# Patient Record
Sex: Male | Born: 1935 | Race: Black or African American | Hispanic: No | Smoking: Former smoker
Health system: Southern US, Community
[De-identification: ages and names within clinical notes are randomized; demographics above are authoritative.]

## PROBLEM LIST (undated history)

## (undated) DIAGNOSIS — J449 Chronic obstructive pulmonary disease, unspecified: Secondary | ICD-10-CM

## (undated) DIAGNOSIS — D649 Anemia, unspecified: Secondary | ICD-10-CM

## (undated) DIAGNOSIS — M869 Osteomyelitis, unspecified: Secondary | ICD-10-CM

## (undated) DIAGNOSIS — I739 Peripheral vascular disease, unspecified: Secondary | ICD-10-CM

## (undated) DIAGNOSIS — S2239XA Fracture of one rib, unspecified side, initial encounter for closed fracture: Secondary | ICD-10-CM

## (undated) DIAGNOSIS — I10 Essential (primary) hypertension: Secondary | ICD-10-CM

## (undated) DIAGNOSIS — K852 Alcohol induced acute pancreatitis without necrosis or infection: Secondary | ICD-10-CM

## (undated) DIAGNOSIS — F1010 Alcohol abuse, uncomplicated: Secondary | ICD-10-CM

## (undated) HISTORY — DX: Alcohol induced acute pancreatitis without necrosis or infection: K85.20

## (undated) HISTORY — DX: Anemia, unspecified: D64.9

## (undated) HISTORY — DX: Fracture of one rib, unspecified side, initial encounter for closed fracture: S22.39XA

## (undated) HISTORY — DX: Osteomyelitis, unspecified: M86.9

## (undated) HISTORY — DX: Chronic obstructive pulmonary disease, unspecified: J44.9

## (undated) HISTORY — DX: Peripheral vascular disease, unspecified: I73.9

## (undated) HISTORY — DX: Alcohol abuse, uncomplicated: F10.10

## (undated) HISTORY — DX: Essential (primary) hypertension: I10

---

## 1998-03-11 ENCOUNTER — Emergency Department (HOSPITAL_COMMUNITY): Admission: EM | Admit: 1998-03-11 | Discharge: 1998-03-11 | Payer: Self-pay | Admitting: Emergency Medicine

## 1998-03-11 ENCOUNTER — Encounter: Payer: Self-pay | Admitting: Emergency Medicine

## 1998-04-02 ENCOUNTER — Other Ambulatory Visit: Admission: RE | Admit: 1998-04-02 | Discharge: 1998-04-02 | Payer: Self-pay | Admitting: Family Medicine

## 2001-05-30 HISTORY — PX: TOTAL KNEE ARTHROPLASTY: SHX125

## 2001-06-08 ENCOUNTER — Emergency Department (HOSPITAL_COMMUNITY): Admission: EM | Admit: 2001-06-08 | Discharge: 2001-06-08 | Payer: Self-pay | Admitting: *Deleted

## 2001-06-08 ENCOUNTER — Encounter: Payer: Self-pay | Admitting: Family Medicine

## 2001-06-20 ENCOUNTER — Encounter: Payer: Self-pay | Admitting: Cardiology

## 2001-06-20 ENCOUNTER — Encounter: Payer: Self-pay | Admitting: Emergency Medicine

## 2001-06-20 ENCOUNTER — Inpatient Hospital Stay (HOSPITAL_COMMUNITY): Admission: EM | Admit: 2001-06-20 | Discharge: 2001-06-25 | Payer: Self-pay

## 2001-06-20 ENCOUNTER — Encounter (INDEPENDENT_AMBULATORY_CARE_PROVIDER_SITE_OTHER): Payer: Self-pay | Admitting: *Deleted

## 2001-06-21 ENCOUNTER — Encounter: Payer: Self-pay | Admitting: Cardiology

## 2001-06-25 ENCOUNTER — Encounter: Payer: Self-pay | Admitting: Cardiology

## 2001-06-30 HISTORY — PX: TOTAL HIP ARTHROPLASTY: SHX124

## 2001-10-06 ENCOUNTER — Encounter: Payer: Self-pay | Admitting: Orthopedic Surgery

## 2001-10-11 ENCOUNTER — Encounter: Payer: Self-pay | Admitting: Orthopedic Surgery

## 2001-10-11 ENCOUNTER — Inpatient Hospital Stay (HOSPITAL_COMMUNITY): Admission: RE | Admit: 2001-10-11 | Discharge: 2001-10-16 | Payer: Self-pay | Admitting: Orthopedic Surgery

## 2001-12-06 ENCOUNTER — Inpatient Hospital Stay (HOSPITAL_COMMUNITY): Admission: RE | Admit: 2001-12-06 | Discharge: 2001-12-09 | Payer: Self-pay | Admitting: Orthopedic Surgery

## 2001-12-09 ENCOUNTER — Inpatient Hospital Stay (HOSPITAL_COMMUNITY)
Admission: AD | Admit: 2001-12-09 | Discharge: 2001-12-13 | Payer: Self-pay | Admitting: Physical Medicine & Rehabilitation

## 2002-05-20 ENCOUNTER — Encounter: Payer: Self-pay | Admitting: Vascular Surgery

## 2002-05-24 ENCOUNTER — Ambulatory Visit (HOSPITAL_COMMUNITY): Admission: RE | Admit: 2002-05-24 | Discharge: 2002-05-24 | Payer: Self-pay | Admitting: Vascular Surgery

## 2002-06-30 HISTORY — PX: FEMORAL-POPLITEAL BYPASS GRAFT: SHX937

## 2002-10-24 ENCOUNTER — Encounter: Payer: Self-pay | Admitting: Vascular Surgery

## 2002-10-26 ENCOUNTER — Inpatient Hospital Stay (HOSPITAL_COMMUNITY): Admission: RE | Admit: 2002-10-26 | Discharge: 2002-10-29 | Payer: Self-pay | Admitting: Vascular Surgery

## 2002-10-29 HISTORY — PX: TOE AMPUTATION: SHX809

## 2002-11-25 ENCOUNTER — Ambulatory Visit (HOSPITAL_COMMUNITY): Admission: RE | Admit: 2002-11-25 | Discharge: 2002-11-28 | Payer: Self-pay | Admitting: Vascular Surgery

## 2002-11-25 ENCOUNTER — Encounter (INDEPENDENT_AMBULATORY_CARE_PROVIDER_SITE_OTHER): Payer: Self-pay | Admitting: Specialist

## 2002-12-16 ENCOUNTER — Encounter (HOSPITAL_BASED_OUTPATIENT_CLINIC_OR_DEPARTMENT_OTHER): Admission: RE | Admit: 2002-12-16 | Discharge: 2003-03-16 | Payer: Self-pay | Admitting: Vascular Surgery

## 2002-12-20 ENCOUNTER — Emergency Department (HOSPITAL_COMMUNITY): Admission: EM | Admit: 2002-12-20 | Discharge: 2002-12-20 | Payer: Self-pay | Admitting: Emergency Medicine

## 2002-12-30 ENCOUNTER — Ambulatory Visit (HOSPITAL_COMMUNITY): Admission: RE | Admit: 2002-12-30 | Discharge: 2002-12-30 | Payer: Self-pay | Admitting: Vascular Surgery

## 2003-09-06 ENCOUNTER — Ambulatory Visit (HOSPITAL_COMMUNITY): Admission: RE | Admit: 2003-09-06 | Discharge: 2003-09-06 | Payer: Self-pay | Admitting: Cardiology

## 2004-04-05 ENCOUNTER — Encounter: Admission: RE | Admit: 2004-04-05 | Discharge: 2004-04-05 | Payer: Self-pay | Admitting: Cardiology

## 2004-10-18 ENCOUNTER — Emergency Department (HOSPITAL_COMMUNITY): Admission: EM | Admit: 2004-10-18 | Discharge: 2004-10-18 | Payer: Self-pay

## 2004-12-30 ENCOUNTER — Ambulatory Visit (HOSPITAL_COMMUNITY): Admission: RE | Admit: 2004-12-30 | Discharge: 2004-12-30 | Payer: Self-pay | Admitting: Vascular Surgery

## 2004-12-30 ENCOUNTER — Encounter (INDEPENDENT_AMBULATORY_CARE_PROVIDER_SITE_OTHER): Payer: Self-pay | Admitting: *Deleted

## 2005-05-15 ENCOUNTER — Encounter: Admission: RE | Admit: 2005-05-15 | Discharge: 2005-05-15 | Payer: Self-pay | Admitting: Cardiology

## 2006-03-21 ENCOUNTER — Emergency Department (HOSPITAL_COMMUNITY): Admission: EM | Admit: 2006-03-21 | Discharge: 2006-03-22 | Payer: Self-pay | Admitting: Emergency Medicine

## 2006-06-01 ENCOUNTER — Ambulatory Visit: Payer: Self-pay | Admitting: Internal Medicine

## 2006-06-16 ENCOUNTER — Ambulatory Visit: Payer: Self-pay | Admitting: Internal Medicine

## 2006-06-16 LAB — CONVERTED CEMR LAB
ALT: 22 units/L (ref 0–40)
Alkaline Phosphatase: 46 units/L (ref 39–117)
BUN: 14 mg/dL (ref 6–23)
Basophils Absolute: 0 10*3/uL (ref 0.0–0.1)
CO2: 21 meq/L (ref 19–32)
Calcium: 8.9 mg/dL (ref 8.4–10.5)
Chloride: 107 meq/L (ref 96–112)
Chol/HDL Ratio, serum: 1.9
Cholesterol: 148 mg/dL (ref 0–200)
Creatinine, Ser: 1.5 mg/dL (ref 0.4–1.5)
HCT: 26.2 % — ABNORMAL LOW (ref 39.0–52.0)
HDL: 77.4 mg/dL (ref >39.0)
Iron: 18 ug/dL — ABNORMAL LOW (ref 42–165)
LDL Cholesterol: 63 mg/dL (ref 0–99)
MCHC: 32 g/dL (ref 30.0–36.0)
Monocytes Relative: 16.7 % — ABNORMAL HIGH (ref 3.0–11.0)
Platelets: 318 10*3/uL (ref 150–400)
RBC: 3.17 M/uL — ABNORMAL LOW (ref 4.22–5.81)
RDW: 19.6 % — ABNORMAL HIGH (ref 11.5–14.6)
Saturation Ratios: 5 % — ABNORMAL LOW (ref 20.0–50.0)
Transferrin: 255.2 mg/dL (ref >=212.0)
Triglyceride fasting, serum: 39 mg/dL (ref 0–149)

## 2006-06-17 ENCOUNTER — Encounter: Admission: RE | Admit: 2006-06-17 | Discharge: 2006-06-17 | Payer: Self-pay | Admitting: Internal Medicine

## 2006-07-09 ENCOUNTER — Ambulatory Visit: Payer: Self-pay | Admitting: Internal Medicine

## 2006-07-09 LAB — CONVERTED CEMR LAB
Basophils Absolute: 0 10*3/uL (ref 0.0–0.1)
Chloride: 108 meq/L (ref 96–112)
Eosinophil percent: 8.2 % — ABNORMAL HIGH (ref 0.0–5.0)
GFR calc non Af Amer: 48 mL/min
Glomerular Filtration Rate, Af Am: 58 mL/min/{1.73_m2}
Glucose, Bld: 106 mg/dL — ABNORMAL HIGH (ref 70–99)
Hemoglobin: 8.4 g/dL — ABNORMAL LOW (ref 13.0–17.0)
Lymphocytes Relative: 34.3 % (ref 12.0–46.0)
MCV: 83.7 fL (ref 78.0–100.0)
Monocytes Absolute: 0.6 10*3/uL (ref 0.2–0.7)
Monocytes Relative: 16.8 % — ABNORMAL HIGH (ref 3.0–11.0)
Neutro Abs: 1.4 10*3/uL (ref 1.4–7.7)
Platelets: 304 10*3/uL (ref 150–400)
Sodium: 138 meq/L (ref 135–145)

## 2006-07-29 ENCOUNTER — Ambulatory Visit: Payer: Self-pay | Admitting: Gastroenterology

## 2006-07-30 ENCOUNTER — Ambulatory Visit (HOSPITAL_COMMUNITY): Admission: RE | Admit: 2006-07-30 | Discharge: 2006-07-30 | Payer: Self-pay | Admitting: Gastroenterology

## 2006-08-03 ENCOUNTER — Ambulatory Visit: Payer: Self-pay | Admitting: Gastroenterology

## 2006-08-03 LAB — CONVERTED CEMR LAB
OCCULT 1: POSITIVE — AB
OCCULT 4: POSITIVE — AB

## 2006-08-12 ENCOUNTER — Ambulatory Visit: Payer: Self-pay | Admitting: Gastroenterology

## 2006-08-19 ENCOUNTER — Ambulatory Visit (HOSPITAL_COMMUNITY): Admission: RE | Admit: 2006-08-19 | Discharge: 2006-08-19 | Payer: Self-pay | Admitting: Gastroenterology

## 2006-12-31 ENCOUNTER — Ambulatory Visit: Payer: Self-pay | Admitting: Internal Medicine

## 2006-12-31 LAB — CONVERTED CEMR LAB
AST: 70 units/L — ABNORMAL HIGH (ref 0–37)
Albumin: 3.6 g/dL (ref 3.5–5.2)
Basophils Absolute: 0 10*3/uL (ref 0.0–0.1)
Bilirubin, Direct: 0.1 mg/dL (ref 0.0–0.3)
Chloride: 112 meq/L (ref 96–112)
Eosinophils Absolute: 0.2 10*3/uL (ref 0.0–0.6)
Eosinophils Relative: 6 % — ABNORMAL HIGH (ref 0.0–5.0)
Ferritin: 13.5 ng/mL — ABNORMAL LOW (ref 22.0–322.0)
GFR calc Af Amer: 54 mL/min
GFR calc non Af Amer: 45 mL/min
Glucose, Bld: 104 mg/dL — ABNORMAL HIGH (ref 70–99)
HCT: 25.8 % — ABNORMAL LOW (ref 39.0–52.0)
Hemoglobin: 8 g/dL — ABNORMAL LOW (ref 13.0–17.0)
Iron: 25 ug/dL — ABNORMAL LOW (ref 42–165)
Lymphocytes Relative: 27.9 % (ref 12.0–46.0)
MCHC: 31.1 g/dL (ref 30.0–36.0)
MCV: 81.9 fL (ref 78.0–100.0)
Monocytes Absolute: 0.6 10*3/uL (ref 0.2–0.7)
Neutro Abs: 1.3 10*3/uL — ABNORMAL LOW (ref 1.4–7.7)
Neutrophils Relative %: 45.5 % (ref 43.0–77.0)
Potassium: 4.3 meq/L (ref 3.5–5.1)
RBC: 3.15 M/uL — ABNORMAL LOW (ref 4.22–5.81)
Retic Ct Pct: 0.9 % (ref 0.4–3.1)
Sodium: 143 meq/L (ref 135–145)
Transferrin: 289.8 mg/dL (ref >=212.0)
WBC: 2.9 10*3/uL — ABNORMAL LOW (ref 4.5–10.5)

## 2007-01-07 ENCOUNTER — Ambulatory Visit: Payer: Self-pay | Admitting: Internal Medicine

## 2007-01-08 ENCOUNTER — Encounter (HOSPITAL_COMMUNITY): Admission: RE | Admit: 2007-01-08 | Discharge: 2007-03-05 | Payer: Self-pay | Admitting: Internal Medicine

## 2007-01-20 ENCOUNTER — Ambulatory Visit: Payer: Self-pay | Admitting: Internal Medicine

## 2007-01-20 LAB — CONVERTED CEMR LAB
Fecal Occult Blood: NEGATIVE
OCCULT 2: NEGATIVE
OCCULT 5: NEGATIVE

## 2007-01-28 ENCOUNTER — Ambulatory Visit: Payer: Self-pay | Admitting: Internal Medicine

## 2007-01-28 LAB — CONVERTED CEMR LAB
Albumin ELP: 54.9 % — ABNORMAL LOW (ref 55.8–66.1)
Alpha-1-Globulin: 4.7 % (ref 2.9–4.9)
Basophils Relative: 1.4 % — ABNORMAL HIGH (ref 0.0–1.0)
Eosinophils Relative: 7.2 % — ABNORMAL HIGH (ref 0.0–5.0)
Folate: 8.1 ng/mL
Hemoglobin: 10.5 g/dL — ABNORMAL LOW (ref 13.0–17.0)
Monocytes Absolute: 0.6 10*3/uL (ref 0.2–0.7)
Monocytes Relative: 13.4 % — ABNORMAL HIGH (ref 3.0–11.0)
Platelets: 292 10*3/uL (ref 150–400)
RDW: 23.3 % — ABNORMAL HIGH (ref 11.5–14.6)
Total Protein, Serum Electrophoresis: 7.4 g/dL (ref 6.0–8.3)
Vitamin B-12: 469 pg/mL (ref 211–911)
WBC: 4.1 10*3/uL — ABNORMAL LOW (ref 4.5–10.5)

## 2007-01-31 ENCOUNTER — Encounter: Admission: RE | Admit: 2007-01-31 | Discharge: 2007-01-31 | Payer: Self-pay | Admitting: Internal Medicine

## 2007-02-02 ENCOUNTER — Encounter: Payer: Self-pay | Admitting: Internal Medicine

## 2007-02-02 LAB — CONVERTED CEMR LAB
Albumin, U: DETECTED %
Alpha 1, Urine: DETECTED % — AB
Alpha 2, Urine: DETECTED % — AB
Time: 24
Total Protein, Urine-Ur/day: 250 mg/24hr — ABNORMAL HIGH (ref 10–140)

## 2007-02-09 ENCOUNTER — Encounter: Admission: RE | Admit: 2007-02-09 | Discharge: 2007-02-09 | Payer: Self-pay | Admitting: *Deleted

## 2007-02-11 ENCOUNTER — Ambulatory Visit: Payer: Self-pay | Admitting: Internal Medicine

## 2007-04-09 ENCOUNTER — Ambulatory Visit: Payer: Self-pay

## 2007-04-09 ENCOUNTER — Ambulatory Visit: Payer: Self-pay | Admitting: Internal Medicine

## 2007-04-16 ENCOUNTER — Ambulatory Visit: Payer: Self-pay | Admitting: Vascular Surgery

## 2007-04-18 ENCOUNTER — Emergency Department (HOSPITAL_COMMUNITY): Admission: EM | Admit: 2007-04-18 | Discharge: 2007-04-18 | Payer: Self-pay | Admitting: Emergency Medicine

## 2007-05-13 ENCOUNTER — Emergency Department (HOSPITAL_COMMUNITY): Admission: EM | Admit: 2007-05-13 | Discharge: 2007-05-13 | Payer: Self-pay | Admitting: Emergency Medicine

## 2007-05-17 ENCOUNTER — Telehealth (INDEPENDENT_AMBULATORY_CARE_PROVIDER_SITE_OTHER): Payer: Self-pay | Admitting: *Deleted

## 2007-05-18 ENCOUNTER — Emergency Department (HOSPITAL_COMMUNITY): Admission: EM | Admit: 2007-05-18 | Discharge: 2007-05-18 | Payer: Self-pay | Admitting: Emergency Medicine

## 2007-07-02 ENCOUNTER — Encounter: Payer: Self-pay | Admitting: Internal Medicine

## 2007-07-02 DIAGNOSIS — D649 Anemia, unspecified: Secondary | ICD-10-CM

## 2007-07-02 DIAGNOSIS — F1010 Alcohol abuse, uncomplicated: Secondary | ICD-10-CM | POA: Insufficient documentation

## 2007-07-02 DIAGNOSIS — I10 Essential (primary) hypertension: Secondary | ICD-10-CM | POA: Insufficient documentation

## 2007-07-02 DIAGNOSIS — J4489 Other specified chronic obstructive pulmonary disease: Secondary | ICD-10-CM | POA: Insufficient documentation

## 2007-07-02 DIAGNOSIS — J449 Chronic obstructive pulmonary disease, unspecified: Secondary | ICD-10-CM

## 2007-07-02 DIAGNOSIS — K219 Gastro-esophageal reflux disease without esophagitis: Secondary | ICD-10-CM | POA: Insufficient documentation

## 2007-07-02 DIAGNOSIS — E785 Hyperlipidemia, unspecified: Secondary | ICD-10-CM

## 2007-07-02 DIAGNOSIS — K5520 Angiodysplasia of colon without hemorrhage: Secondary | ICD-10-CM | POA: Insufficient documentation

## 2007-07-13 ENCOUNTER — Telehealth: Payer: Self-pay | Admitting: Internal Medicine

## 2007-07-26 ENCOUNTER — Ambulatory Visit: Payer: Self-pay | Admitting: Internal Medicine

## 2007-07-26 LAB — CONVERTED CEMR LAB
BUN: 14 mg/dL (ref 6–23)
CO2: 28 meq/L (ref 19–32)
Chloride: 104 meq/L (ref 96–112)
GFR calc non Af Amer: 52 mL/min
Glucose, Bld: 112 mg/dL — ABNORMAL HIGH (ref 70–99)
Potassium: 4.1 meq/L (ref 3.5–5.1)

## 2007-08-23 ENCOUNTER — Encounter: Payer: Self-pay | Admitting: Internal Medicine

## 2007-12-14 ENCOUNTER — Ambulatory Visit: Payer: Self-pay | Admitting: Internal Medicine

## 2007-12-14 ENCOUNTER — Inpatient Hospital Stay (HOSPITAL_COMMUNITY): Admission: EM | Admit: 2007-12-14 | Discharge: 2007-12-21 | Payer: Self-pay | Admitting: Emergency Medicine

## 2007-12-15 ENCOUNTER — Encounter: Payer: Self-pay | Admitting: Internal Medicine

## 2007-12-15 ENCOUNTER — Telehealth: Payer: Self-pay | Admitting: Internal Medicine

## 2007-12-19 ENCOUNTER — Encounter: Payer: Self-pay | Admitting: Internal Medicine

## 2007-12-21 ENCOUNTER — Telehealth: Payer: Self-pay | Admitting: Internal Medicine

## 2007-12-24 ENCOUNTER — Emergency Department (HOSPITAL_COMMUNITY): Admission: EM | Admit: 2007-12-24 | Discharge: 2007-12-25 | Payer: Self-pay | Admitting: Emergency Medicine

## 2007-12-27 ENCOUNTER — Ambulatory Visit: Payer: Self-pay | Admitting: Internal Medicine

## 2007-12-27 DIAGNOSIS — K859 Acute pancreatitis without necrosis or infection, unspecified: Secondary | ICD-10-CM | POA: Insufficient documentation

## 2007-12-27 DIAGNOSIS — R269 Unspecified abnormalities of gait and mobility: Secondary | ICD-10-CM

## 2007-12-27 LAB — CONVERTED CEMR LAB
ALT: 32 units/L (ref 0–53)
Basophils Absolute: 0 10*3/uL (ref 0.0–0.1)
Basophils Relative: 0.2 % (ref 0.0–1.0)
CO2: 23 meq/L (ref 19–32)
Calcium: 8.6 mg/dL (ref 8.4–10.5)
Creatinine, Ser: 1.2 mg/dL (ref 0.4–1.5)
HCT: 27.5 % — ABNORMAL LOW (ref 39.0–52.0)
Hemoglobin: 9 g/dL — ABNORMAL LOW (ref 13.0–17.0)
Lipase: 49 units/L (ref 11.0–59.0)
Lymphocytes Relative: 22.6 % (ref 12.0–46.0)
MCHC: 32.8 g/dL (ref 30.0–36.0)
MCV: 105.7 fL — ABNORMAL HIGH (ref 78.0–100.0)
Neutro Abs: 2.9 10*3/uL (ref 1.4–7.7)
RBC: 2.6 M/uL — ABNORMAL LOW (ref 4.22–5.81)
Total Bilirubin: 1.1 mg/dL (ref 0.3–1.2)
Total Protein: 6.3 g/dL (ref 6.0–8.3)

## 2007-12-28 ENCOUNTER — Telehealth: Payer: Self-pay | Admitting: Internal Medicine

## 2008-01-05 ENCOUNTER — Telehealth: Payer: Self-pay | Admitting: Internal Medicine

## 2008-01-07 ENCOUNTER — Telehealth: Payer: Self-pay | Admitting: Internal Medicine

## 2008-01-11 ENCOUNTER — Encounter: Payer: Self-pay | Admitting: Internal Medicine

## 2008-01-14 ENCOUNTER — Ambulatory Visit: Payer: Self-pay | Admitting: Vascular Surgery

## 2008-01-19 ENCOUNTER — Telehealth (INDEPENDENT_AMBULATORY_CARE_PROVIDER_SITE_OTHER): Payer: Self-pay | Admitting: *Deleted

## 2008-02-01 ENCOUNTER — Ambulatory Visit: Payer: Self-pay | Admitting: Internal Medicine

## 2008-02-01 DIAGNOSIS — M1990 Unspecified osteoarthritis, unspecified site: Secondary | ICD-10-CM | POA: Insufficient documentation

## 2008-02-02 ENCOUNTER — Telehealth: Payer: Self-pay | Admitting: Internal Medicine

## 2008-03-02 ENCOUNTER — Telehealth (INDEPENDENT_AMBULATORY_CARE_PROVIDER_SITE_OTHER): Payer: Self-pay | Admitting: *Deleted

## 2008-03-02 ENCOUNTER — Telehealth: Payer: Self-pay | Admitting: Internal Medicine

## 2008-03-14 ENCOUNTER — Ambulatory Visit: Payer: Self-pay | Admitting: Internal Medicine

## 2008-03-14 LAB — CONVERTED CEMR LAB
CO2: 23 meq/L (ref 19–32)
Chloride: 114 meq/L — ABNORMAL HIGH (ref 96–112)
Eosinophils Relative: 5.1 % — ABNORMAL HIGH (ref 0.0–5.0)
Folate: >20 ng/mL
HCT: 31.9 % — ABNORMAL LOW (ref 39.0–52.0)
Lymphocytes Relative: 44 % (ref 12.0–46.0)
Monocytes Relative: 9.6 % (ref 3.0–12.0)
Neutrophils Relative %: 40 % — ABNORMAL LOW (ref 43.0–77.0)
Platelets: 244 10*3/uL (ref 150–400)
Potassium: 4.4 meq/L (ref 3.5–5.1)
Sodium: 142 meq/L (ref 135–145)
WBC: 3.5 10*3/uL — ABNORMAL LOW (ref 4.5–10.5)

## 2008-03-16 ENCOUNTER — Encounter: Payer: Self-pay | Admitting: Internal Medicine

## 2008-03-21 ENCOUNTER — Telehealth: Payer: Self-pay | Admitting: Internal Medicine

## 2008-03-22 ENCOUNTER — Telehealth: Payer: Self-pay | Admitting: Internal Medicine

## 2008-03-29 ENCOUNTER — Telehealth: Payer: Self-pay | Admitting: Internal Medicine

## 2008-03-30 ENCOUNTER — Telehealth: Payer: Self-pay | Admitting: Internal Medicine

## 2008-05-29 ENCOUNTER — Ambulatory Visit: Payer: Self-pay | Admitting: Internal Medicine

## 2008-05-29 ENCOUNTER — Inpatient Hospital Stay (HOSPITAL_COMMUNITY): Admission: EM | Admit: 2008-05-29 | Discharge: 2008-06-09 | Payer: Self-pay | Admitting: Emergency Medicine

## 2008-05-30 ENCOUNTER — Encounter: Payer: Self-pay | Admitting: Internal Medicine

## 2008-05-30 ENCOUNTER — Ambulatory Visit: Payer: Self-pay | Admitting: Surgery

## 2008-05-30 DIAGNOSIS — M869 Osteomyelitis, unspecified: Secondary | ICD-10-CM

## 2008-05-30 HISTORY — DX: Osteomyelitis, unspecified: M86.9

## 2008-05-30 HISTORY — PX: OTHER SURGICAL HISTORY: SHX169

## 2008-06-03 ENCOUNTER — Encounter (INDEPENDENT_AMBULATORY_CARE_PROVIDER_SITE_OTHER): Payer: Self-pay | Admitting: Orthopedic Surgery

## 2008-06-03 ENCOUNTER — Encounter: Payer: Self-pay | Admitting: Internal Medicine

## 2008-06-10 ENCOUNTER — Encounter: Payer: Self-pay | Admitting: Internal Medicine

## 2008-06-13 ENCOUNTER — Telehealth: Payer: Self-pay | Admitting: Internal Medicine

## 2008-07-03 ENCOUNTER — Encounter: Payer: Self-pay | Admitting: Internal Medicine

## 2008-07-03 ENCOUNTER — Telehealth: Payer: Self-pay | Admitting: Internal Medicine

## 2008-07-13 IMAGING — CT CT ABDOMEN W/ CM
2 of 5 series · 16 of 46 positions shown, 18 images · IV contrast (omni 300/water & 100 ML OMNI 300)
Comparison: 12/14/2007

CLINICAL DATA: Pancreatitis, rib fracture, chest pain, urinary
tract infection.

CT ABDOMEN WITH CONTRAST
TECHNIQUE: Multidetector CT imaging of the abdomen was performed
following the standard protocol during bolus administration of
intravenous contrast.
Contrast: 100 ml [id number]-TZZ.

[Series 2: abd w/ · axial · 0.74mm/px · z∈[-306,-56]mm · 13 of 58 slices shown, 15 images]
[im 4/58  soft-tissue]
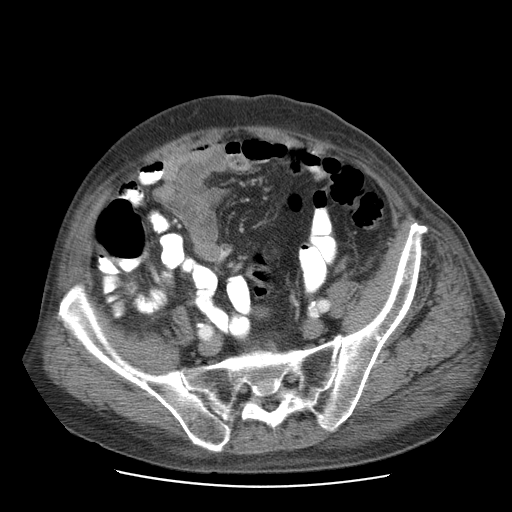
[im 4/58  bone]
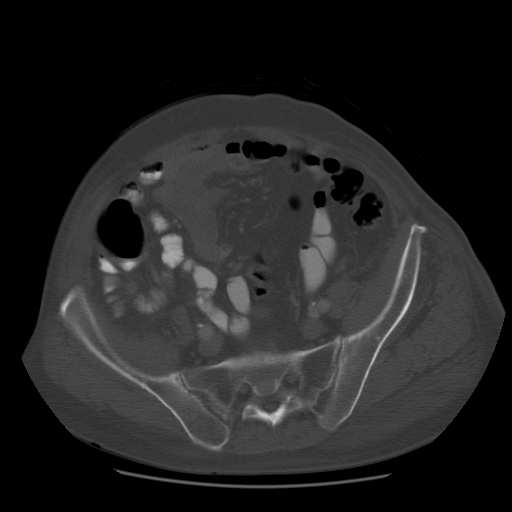
[im 8/58  soft-tissue]
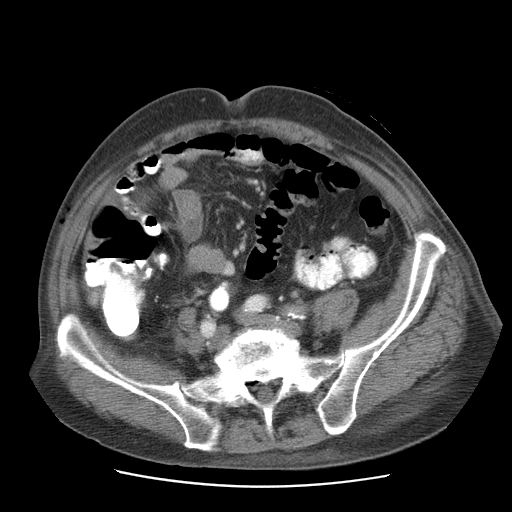
[im 12/58  soft-tissue]
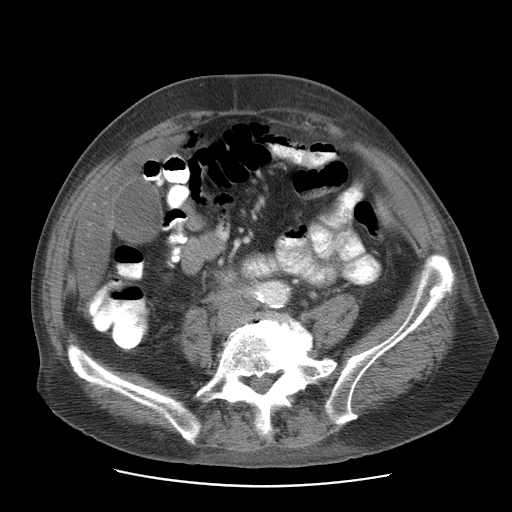
[im 16/58  soft-tissue]
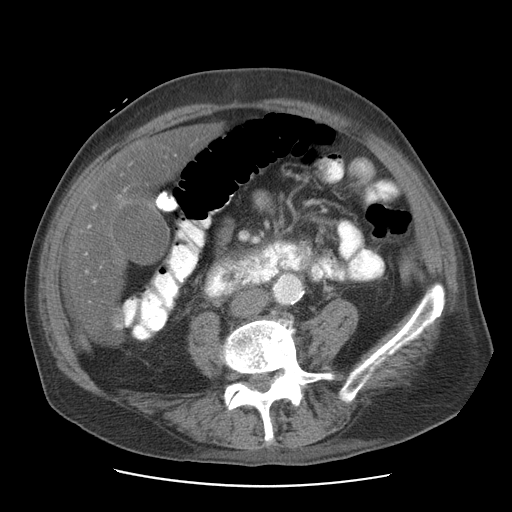
[im 20/58  soft-tissue]
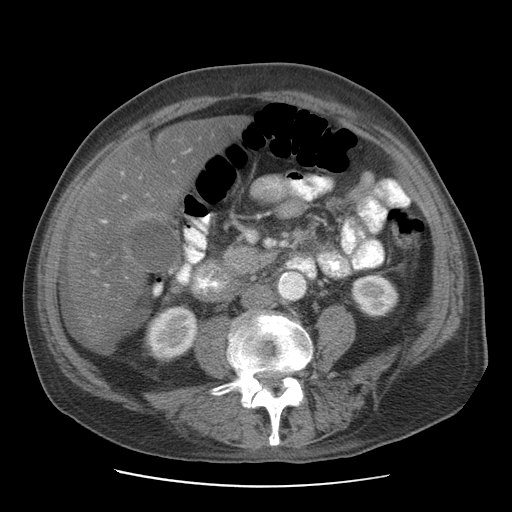
[im 23/58  soft-tissue]
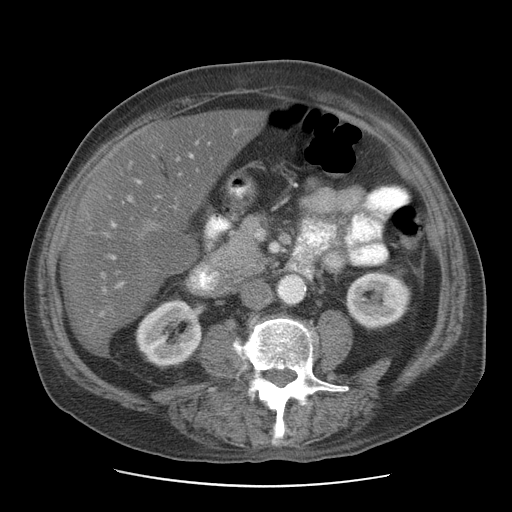
[im 31/58  soft-tissue]
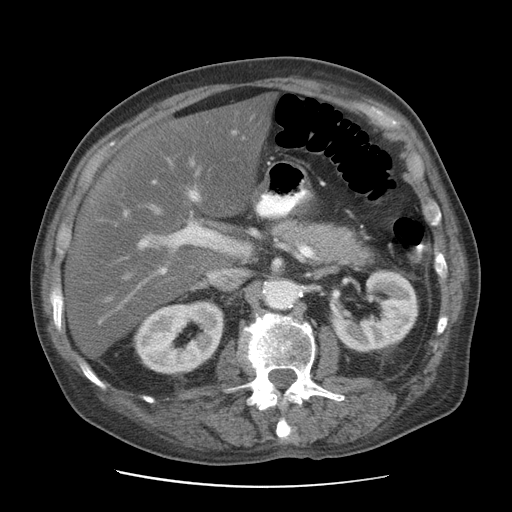
[im 35/58  soft-tissue]
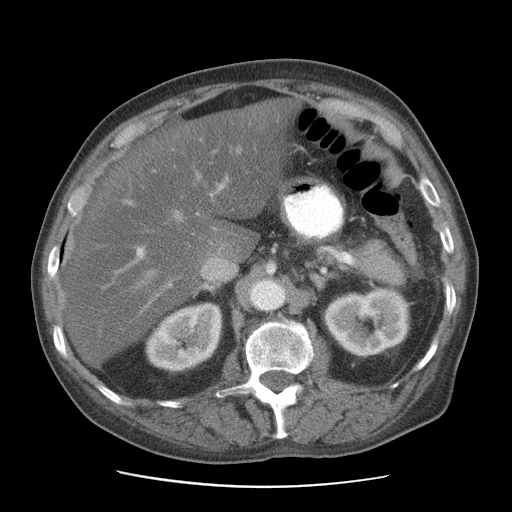
[im 39/58  soft-tissue]
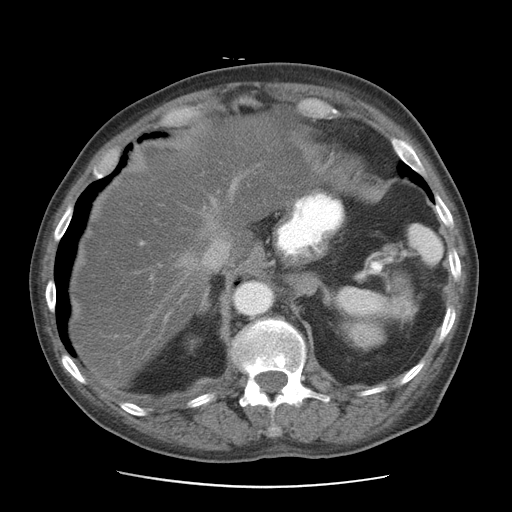
[im 39/58  bone]
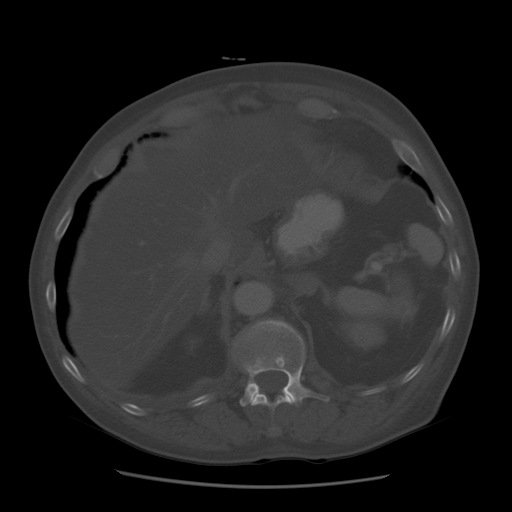
[im 42/58  soft-tissue]
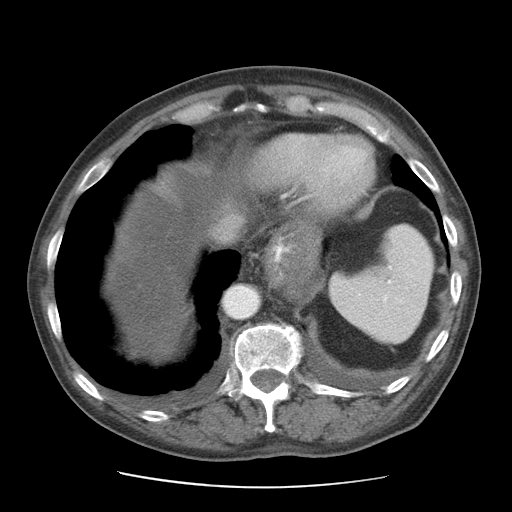
[im 46/58  soft-tissue]
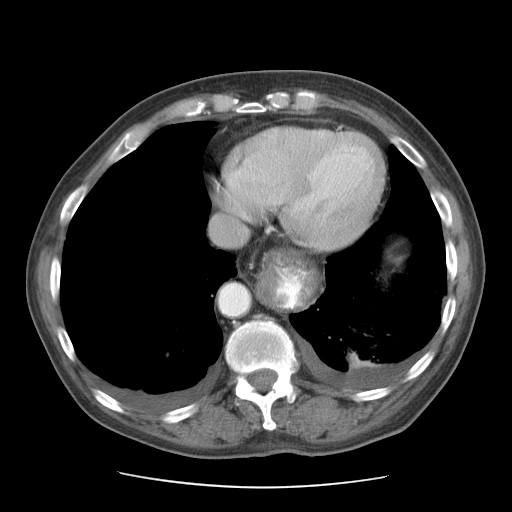
[im 50/58  soft-tissue]
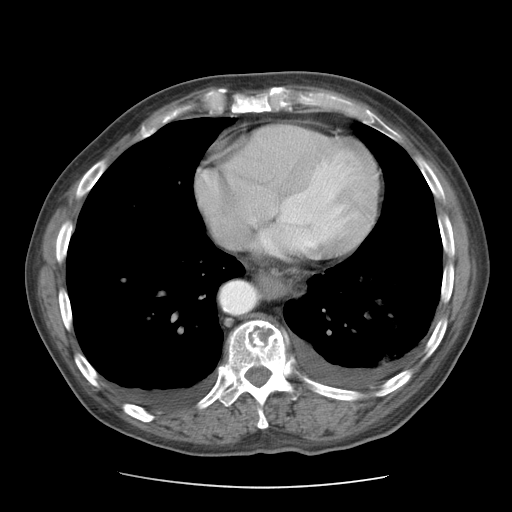
[im 54/58  soft-tissue]
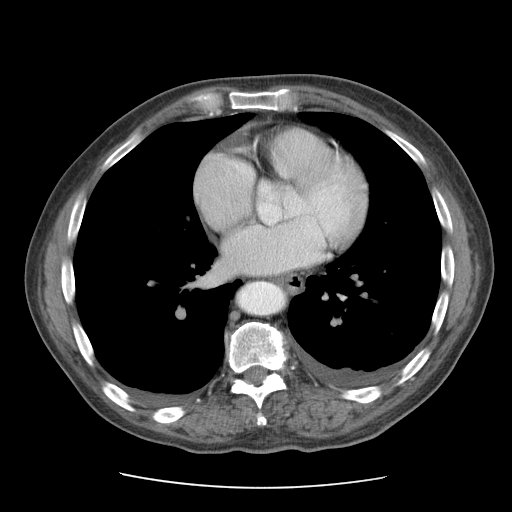

[Series 400: cor abd · coronal · 0.74mm/px · 3 of 101 slices shown]
[im 34/101  soft-tissue]
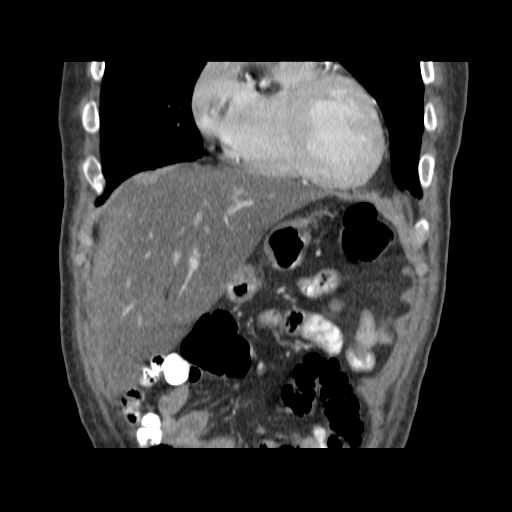
[im 45/101  soft-tissue]
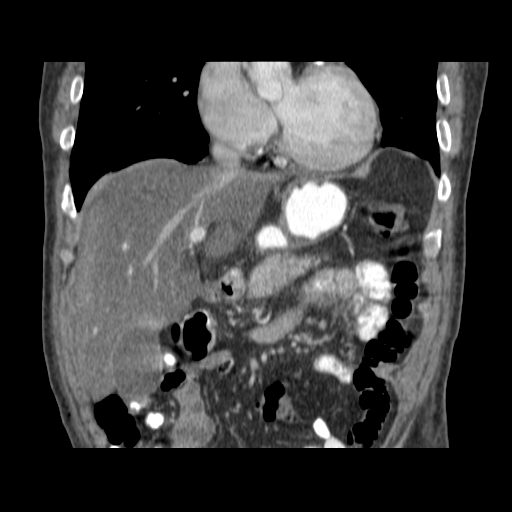
[im 56/101  soft-tissue]
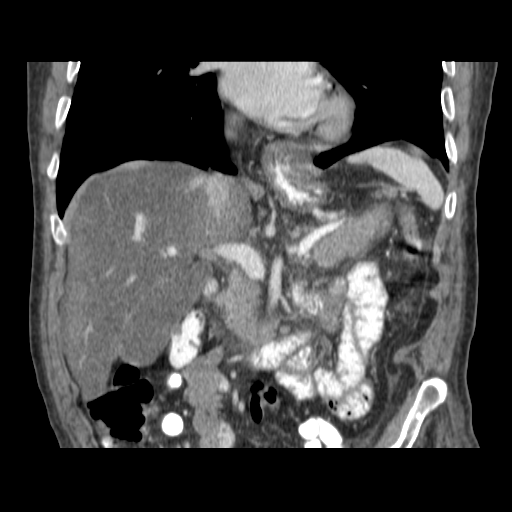

[16 of 46 positions shown; findings below may reference images not displayed]

FINDINGS: Small bilateral pleural effusions are present, left
slightly greater than right.  Associated atelectasis.  Large
hemangioma noted in the T11 vertebral body.  No pneumothorax
identified on visualization of the lung bases.

Fatty liver.  Gallbladder appears within normal limits.  Spleen
unremarkable.  Abdominal aortic atherosclerosis extending into the
iliac arteries.  Pancreas shows normal enhancement with minimal
peripancreatic stranding around the uncinate process, likely
representing pancreatitis. There are no complications of
pancreatitis.  No focal fluid collections are identified.  The
adjacent vascular structures appear within normal limits. There is
some persistent thickening of the junctions of the second third
part of the duodenum.  Nonspecific retroperitoneal stranding.
Moderate hiatal hernia.  Aside from the duodenum, abdominal small
bowel and colon appears within normal limits. Kidneys show normal
enhancement.  Adrenals unremarkable. Delayed excretion of contrast
appears within normal limits. Coronary artery atherosclerosis.
Subcutaneous injection site noted along the right anterior
abdominal wall.
IMPRESSION: 1.  New small bilateral pleural effusions, left slightly greater
than right.
2.  Moderate hiatal hernia with persistent gastric thickening in
the chest, probably related to gastritis.
3.  Inflammatory changes around the head of the pancreas have
improved although there is some residual thickening of the
junctions of the second third part of the duodenum, suggesting
duodenitis. With the inflammatory changes of the pancreas, focal
pancreatitis is likely, with associated inflammatory changes of the
duodenum.
4.  Fatty liver and old rib fractures.

## 2008-07-14 ENCOUNTER — Emergency Department (HOSPITAL_COMMUNITY): Admission: EM | Admit: 2008-07-14 | Discharge: 2008-07-14 | Payer: Self-pay | Admitting: Emergency Medicine

## 2008-07-17 ENCOUNTER — Telehealth: Payer: Self-pay | Admitting: Internal Medicine

## 2008-07-27 ENCOUNTER — Ambulatory Visit: Payer: Self-pay | Admitting: Vascular Surgery

## 2008-08-01 ENCOUNTER — Ambulatory Visit: Payer: Self-pay | Admitting: Internal Medicine

## 2008-08-02 DIAGNOSIS — M86179 Other acute osteomyelitis, unspecified ankle and foot: Secondary | ICD-10-CM | POA: Insufficient documentation

## 2008-08-03 ENCOUNTER — Telehealth: Payer: Self-pay | Admitting: Internal Medicine

## 2008-08-15 ENCOUNTER — Telehealth: Payer: Self-pay | Admitting: Internal Medicine

## 2008-09-24 ENCOUNTER — Emergency Department (HOSPITAL_COMMUNITY): Admission: EM | Admit: 2008-09-24 | Discharge: 2008-09-25 | Payer: Self-pay | Admitting: Emergency Medicine

## 2008-11-29 ENCOUNTER — Telehealth: Payer: Self-pay | Admitting: Internal Medicine

## 2008-12-25 ENCOUNTER — Ambulatory Visit: Payer: Self-pay | Admitting: Internal Medicine

## 2008-12-25 ENCOUNTER — Inpatient Hospital Stay (HOSPITAL_COMMUNITY): Admission: EM | Admit: 2008-12-25 | Discharge: 2009-01-02 | Payer: Self-pay | Admitting: Emergency Medicine

## 2008-12-28 ENCOUNTER — Encounter: Payer: Self-pay | Admitting: Endocrinology

## 2008-12-28 ENCOUNTER — Ambulatory Visit: Payer: Self-pay | Admitting: Vascular Surgery

## 2009-01-04 ENCOUNTER — Telehealth (INDEPENDENT_AMBULATORY_CARE_PROVIDER_SITE_OTHER): Payer: Self-pay | Admitting: *Deleted

## 2009-02-01 ENCOUNTER — Telehealth: Payer: Self-pay | Admitting: Internal Medicine

## 2009-02-12 ENCOUNTER — Ambulatory Visit: Payer: Self-pay | Admitting: Internal Medicine

## 2009-03-01 ENCOUNTER — Telehealth: Payer: Self-pay | Admitting: Internal Medicine

## 2009-04-04 ENCOUNTER — Telehealth: Payer: Self-pay | Admitting: Internal Medicine

## 2009-04-05 ENCOUNTER — Ambulatory Visit: Payer: Self-pay | Admitting: Internal Medicine

## 2009-04-05 DIAGNOSIS — F329 Major depressive disorder, single episode, unspecified: Secondary | ICD-10-CM

## 2009-04-27 ENCOUNTER — Ambulatory Visit: Payer: Self-pay | Admitting: Vascular Surgery

## 2009-04-27 ENCOUNTER — Encounter: Payer: Self-pay | Admitting: Internal Medicine

## 2009-05-03 ENCOUNTER — Ambulatory Visit: Payer: Self-pay | Admitting: Internal Medicine

## 2009-05-03 DIAGNOSIS — G4720 Circadian rhythm sleep disorder, unspecified type: Secondary | ICD-10-CM | POA: Insufficient documentation

## 2009-05-07 ENCOUNTER — Telehealth: Payer: Self-pay | Admitting: Internal Medicine

## 2009-06-02 ENCOUNTER — Ambulatory Visit: Payer: Self-pay | Admitting: Internal Medicine

## 2009-07-09 ENCOUNTER — Encounter: Payer: Self-pay | Admitting: Internal Medicine

## 2009-08-01 ENCOUNTER — Telehealth (INDEPENDENT_AMBULATORY_CARE_PROVIDER_SITE_OTHER): Payer: Self-pay | Admitting: *Deleted

## 2009-08-10 ENCOUNTER — Telehealth: Payer: Self-pay | Admitting: Internal Medicine

## 2009-10-01 ENCOUNTER — Telehealth: Payer: Self-pay | Admitting: Internal Medicine

## 2009-10-24 ENCOUNTER — Ambulatory Visit: Payer: Self-pay | Admitting: Internal Medicine

## 2009-11-09 ENCOUNTER — Ambulatory Visit: Payer: Self-pay | Admitting: Vascular Surgery

## 2009-11-09 ENCOUNTER — Encounter: Payer: Self-pay | Admitting: Internal Medicine

## 2010-02-22 ENCOUNTER — Telehealth: Payer: Self-pay | Admitting: Internal Medicine

## 2010-04-15 ENCOUNTER — Ambulatory Visit: Payer: Self-pay | Admitting: Internal Medicine

## 2010-07-10 ENCOUNTER — Ambulatory Visit: Admit: 2010-07-10 | Payer: Self-pay | Admitting: Vascular Surgery

## 2010-07-30 NOTE — Letter (Signed)
[facility]: [facility]   Imported By: Sherian Rein 12/11/2009 09:48:11  _____________________________________________________________________  External Attachment:    Type:   Image     Comment:   External Document

## 2010-07-30 NOTE — Progress Notes (Signed)
Summary: REFILL -Trazodone  Phone Note Refill Request   Refills Requested: Medication #1:  TRAZODONE HCL 50 MG TABS 1 by mouth at bedtime [facility] [address]  Initial call taken by: Lamar Sprinkles, CMA,  February 22, 2010 9:28 AM  Follow-up for Phone Call        ok to rfill as needed  Follow-up by: Jacques Navy MD,  February 22, 2010 3:29 PM    Prescriptions: TRAZODONE HCL 50 MG TABS (TRAZODONE HCL) 1 by mouth at bedtime  #90 x 1   Entered by:   Lamar Sprinkles, CMA   Authorized by:   Jacques Navy MD   Signed by:   Lamar Sprinkles, CMA on 02/22/2010   Method used:   Telephoned to ...       [facility] [address] (retail)       [phone]       [place], [place]  [address]       Ph: [phone]       Fax: [phone]   RxID:   [id number]

## 2010-07-30 NOTE — Assessment & Plan Note (Signed)
Summary: PAIN IN CHEST AND STOMACH FOR A WEEK/ DRIVER COULDN'T COME SO...   Vital Signs:  Patient profile:   77 year old male Height:      75 inches Weight:      178 pounds BMI:     22.33 O2 Sat:      97 % on Room air Temp:     98.0 degrees F oral Pulse rate:   67 / minute BP sitting:   130 / 76  (left arm) Cuff size:   large  Vitals Entered By: Bill Salinas CMA (October 24, 2009 11:29 AM)  O2 Flow:  Room air CC: pt here with complaint of soreness in his upper abd/ chest area x 1 week/ ab   Primary Care Provider:  Casimiro Needle Ivyana Locey  CC:  pt here with complaint of soreness in his upper abd/ chest area x 1 week/ ab.  History of Present Illness: Patient presents for evaluation of pain in the chest and stomach for about a week. The pain is intermittent and is not exertional in nature. The pain is not very bad and he can reproduce the pain with a deep breath. No SOB, no radiation, no diaphoresis. He has not tried taking any medication. No precepitating work or injury.   Current Medications (verified): 1)  Diovan Hct 160-25 Mg Tabs (Valsartan-Hydrochlorothiazide) .Marland Kitchen.. 1 By Mouth Once Daily 2)  Aspirin Low Dose 81 Mg  Tabs (Aspirin) .... Take 1 Tablet By Mouth Once A Day 3)  Amlodipine Besylate 10 Mg  Tabs (Amlodipine Besylate) .... Take 1 Tablet By Mouth Once A Day 4)  Trazodone Hcl 50 Mg Tabs (Trazodone Hcl) .Marland Kitchen.. 1 By Mouth At Bedtime 5)  Iron 325 (65 Fe) Mg  Tabs (Ferrous Sulfate) .... Take 1 Tablet By Mouth Once A Day 6)  Ranitidine Hcl 150 Mg  Tabs (Ranitidine Hcl) .... Take 1 Tablet By Mouth Once Daily 7)  Creon 12000 Unit Cpep (Pancrelipase (Lip-Prot-Amyl)) .... 2 Tabs By Mouth Before Meals Three Times A Day 8)  Meloxicam 15 Mg  Tabs (Meloxicam) .Marland Kitchen.. 1 By Mouth Once Daily For Pain. 9)  Plavix 75 Mg Tabs (Clopidogrel Bisulfate) .... Take 1 Tablet By Mouth Once A Day 10)  Multivitamins   Tabs (Multiple Vitamin) .... Take 1 Tablet By Mouth Once A Day 11)  Tylenol Arthritis Pain 650 Mg  Cr-Tabs (Acetaminophen) .... As Directed As Needed 12)  Miralax  Powd (Polyethylene Glycol 3350) .... Take 17 Grams By Mouth At Bedtime 13)  Sertraline Hcl 50 Mg Tabs (Sertraline Hcl) .Marland Kitchen.. 1 By Mouth Q Am  Allergies (verified): No Known Drug Allergies  Past History:  Past Medical History: Last updated: 08/01/2008 Anemia Hypertension Alcohol Abuse COPD Hyperlipidemia GERD PVD Alcoholic pancreatitis 11/2006, June '09 rib fracture osteomylitis-right foot Dec '09  Past Surgical History: Last updated: 08/01/2008 Total hip replacement 2003 EGD 2008 Left Knee Total 12/02 RIght Foot  Great Toe mputation 5/04 S/P femoral to left knee popliteal bypass greater vein graft 2004  Partial ray amputation right foot 1st metatarsal 12/'09  Family History: Last updated: 07/26/2007 Positive for brother who died of throat cancer Sister who died of lung cancer.   Mother is deceased with history of stroke  Social History: Last updated: 12/27/2007 Patient is widowed.   He has a remote history of tobacco in the past.   He quit in the late 60s.   Alcohol use-no (History of heavy drinking)  Risk Factors: Exercise: no (02/01/2008)  Risk Factors: Smoking Status: quit (  07/02/2007)  Review of Systems       The patient complains of chest pain.  The patient denies anorexia, fever, weight loss, weight gain, hoarseness, syncope, dyspnea on exertion, peripheral edema, prolonged cough, hemoptysis, abdominal pain, severe indigestion/heartburn, genital sores, muscle weakness, suspicious skin lesions, difficulty walking, unusual weight change, abnormal bleeding, enlarged lymph nodes, and angioedema.    Physical Exam  General:  Well-developed,well-nourished,in no acute distress; alert,appropriate and cooperative throughout examination Head:  normocephalic and atraumatic.   Neck:  full ROM.   Chest Wall:  Tender at the costo-sternal junction left R6 Lungs:  no intercostal retractions, normal breath  sounds, no crackles, and no wheezes.   Heart:  normal rate, regular rhythm, no murmur, and no JVD.     Impression & Recommendations:  Problem # 1:  COSTOCHONDRITIS, ACUTE (ICD-733.6) Patient presented for chest pain: it is very atypical and based on exam c/w costochondritis. The was explained to  the patient and his daughter, he is provided a handout from "CareNotes."  Plan - continue NSAIDs            lineament of choice  Problem # 2:  LOC OSTEOARTHROS NOT SPEC PRIM/SEC UNSPEC SITE (ICD-715.30) Patient is carefully advised to take either meloxicam or otc aleve, not both. He is advised to take a generic otc H2 blocker.   His updated medication list for this problem includes:    Aspirin Low Dose 81 Mg Tabs (Aspirin) .Marland Kitchen... Take 1 tablet by mouth once a day    Meloxicam 15 Mg Tabs (Meloxicam) .Marland Kitchen... 1 by mouth once daily for pain.    Tylenol Arthritis Pain 650 Mg Cr-tabs (Acetaminophen) .Marland Kitchen... As directed as needed  Complete Medication List: 1)  Diovan Hct 160-25 Mg Tabs (Valsartan-hydrochlorothiazide) .Marland Kitchen.. 1 by mouth once daily 2)  Aspirin Low Dose 81 Mg Tabs (Aspirin) .... Take 1 tablet by mouth once a day 3)  Amlodipine Besylate 10 Mg Tabs (Amlodipine besylate) .... Take 1 tablet by mouth once a day 4)  Trazodone Hcl 50 Mg Tabs (Trazodone hcl) .Marland Kitchen.. 1 by mouth at bedtime 5)  Iron 325 (65 Fe) Mg Tabs (Ferrous sulfate) .... Take 1 tablet by mouth once a day 6)  Ranitidine Hcl 150 Mg Tabs (Ranitidine hcl) .... Take 1 tablet by mouth once daily 7)  Creon 12000 Unit Cpep (Pancrelipase (lip-prot-amyl)) .... 2 tabs by mouth before meals three times a day 8)  Meloxicam 15 Mg Tabs (Meloxicam) .Marland Kitchen.. 1 by mouth once daily for pain. 9)  Plavix 75 Mg Tabs (Clopidogrel bisulfate) .... Take 1 tablet by mouth once a day 10)  Multivitamins Tabs (Multiple vitamin) .... Take 1 tablet by mouth once a day 11)  Tylenol Arthritis Pain 650 Mg Cr-tabs (Acetaminophen) .... As directed as needed 12)  Miralax Powd  (Polyethylene glycol 3350) .... Take 17 grams by mouth at bedtime 13)  Sertraline Hcl 50 Mg Tabs (Sertraline hcl) .Marland Kitchen.. 1 by mouth q am  Patient Instructions: 1)  chest-wall pain - costochondritis - inflammation of the joint between the rib and the sternum. Plan - continue anti-inflammatory medication (see below) and use of any rub you like, BenGay, Ice-Hot, etc. 2)  Arthritis - take EITHER the meloxicam once a day or Aleve but not both. You should also take Pepcid, etc (generics are fine) to protect your stomach.    Immunization History:  Influenza Immunization History:    Influenza:  historical (04/25/2009)

## 2010-07-30 NOTE — Progress Notes (Signed)
Summary: med change  Phone Note Call from Patient Call back at Home Phone [phone]   Caller: Britta Mccreedy Summary of Call: Other called requesting a call back to discuss medication changes. Initial call taken by: Rock Nephew CMA,  August 01, 2009 1:41 PM  Follow-up for Phone Call        Pt's HMO will only give him short term supply of Avalide. Do you suggest any alternative. Please Advise Follow-up by: Ami Bullins CMA,  August 01, 2009 1:53 PM  Additional Follow-up for Phone Call Additional follow up Details #1::        what are the alternatives that are covered? Additional Follow-up by: Jacques Navy MD,  August 01, 2009 3:14 PM    Additional Follow-up for Phone Call Additional follow up Details #2::    Diovan, Diovan HCT, Losartan potassium, Losartan HCTZ, Micardis and Micardis HCT Follow-up by: Ami Bullins CMA,  August 08, 2009 4:14 PM  Additional Follow-up for Phone Call Additional follow up Details #3:: Details for Additional Follow-up Action Taken: diovan/hct 160/25 once daily. Rx eScribed to pharmacy. BP check in 7-10 days Additional Follow-up by: Jacques Navy MD,  August 09, 2009 10:51 AM  New/Updated Medications: DIOVAN HCT 160-25 MG TABS (VALSARTAN-HYDROCHLOROTHIAZIDE) 1 by mouth once daily Prescriptions: DIOVAN HCT 160-25 MG TABS (VALSARTAN-HYDROCHLOROTHIAZIDE) 1 by mouth once daily  #30 x 12   Entered and Authorized by:   Jacques Navy MD   Signed by:   Jacques Navy MD on 08/09/2009   Method used:   Electronically to        [facility] [address] (retail)       [phone]       [place], [place]  [address]       Ph: [phone]       Fax: [phone]   RxID:   [id number]  08/09/2009- spoke with pt's daughter Britta Mccreedy) I advised her that her father's avalide was changed to Diovan HCT and that a prescription was sent into [facility] on [address]. Pt's daughter was advised to make appt for her  father 7-10 days after starting medication. She stated she would call back to set appt up.

## 2010-07-30 NOTE — Medication Information (Signed)
Summary: [facility]   Imported By: Lester Blackshear 08/18/2009 09:34:41  _____________________________________________________________________  External Attachment:    Type:   Image     Comment:   External Document

## 2010-07-30 NOTE — Progress Notes (Signed)
  Phone Note Refill Request Message from:  Fax from Pharmacy on October 01, 2009 11:12 AM  Refills Requested: Medication #1:  TRAZODONE HCL 50 MG TABS 1 by mouth at bedtime   Last Refilled: 08/30/2009  Medication #2:  SERTRALINE HCL 50 MG TABS 1 by mouth q AM.   Last Refilled: 08/30/2009 received fax from [facility]. Please Advise refills  Initial call taken by: Ami Bullins CMA,  October 01, 2009 11:13 AM  Follow-up for Phone Call        OK for refill x 3. Will need an appt in next 3 monhts Follow-up by: Jacques Navy MD,  October 01, 2009 12:20 PM    Prescriptions: TRAZODONE HCL 50 MG TABS (TRAZODONE HCL) 1 by mouth at bedtime  #30 x 3   Entered by:   Ami Bullins CMA   Authorized by:   Jacques Navy MD   Signed by:   Bill Salinas CMA on 10/01/2009   Method used:   Faxed to ...       [facility] [address] (retail)       [phone]       [place], [place]  [address]       Ph: [phone]       Fax: [phone]   RxID:   [id number] SERTRALINE HCL 50 MG TABS (SERTRALINE HCL) 1 by mouth q AM  #30 x 3   Entered by:   Ami Bullins CMA   Authorized by:   Jacques Navy MD   Signed by:   Bill Salinas CMA on 10/01/2009   Method used:   Faxed to ...       [facility] [address] (retail)       [phone]       [place], [place]  [address]       Ph: [phone]       Fax: [phone]   RxID:   [id number]

## 2010-07-30 NOTE — Assessment & Plan Note (Signed)
Summary: FLU SHOT/NWS  Nurse Visit   Vital Signs:  Patient profile:   77 year old male Height:      75 inches  Vitals Entered By: Bill Salinas CMA (April 15, 2010 4:49 PM)  Allergies: No Known Drug Allergies  Orders Added: 1)  Flu Vaccine 40yrs + MEDICARE PATIENTS [Q2039] 2)  Administration Flu vaccine - MCR [G0008]       Flu Vaccine Consent Questions     Do you have a history of severe allergic reactions to this vaccine? no    Any prior history of allergic reactions to egg and/or gelatin? no    Do you have a sensitivity to the preservative Thimersol? no    Do you have a past history of Guillan-Barre Syndrome? no    Do you currently have an acute febrile illness? no    Have you ever had a severe reaction to latex? no    Vaccine information given and explained to patient? yes    Are you currently pregnant? no    Lot Number:[id number]   Exp Date:12/28/2010   Site Given  Left Deltoid IM

## 2010-07-30 NOTE — Progress Notes (Signed)
  Phone Note Refill Request Message from:  Fax from Pharmacy on August 10, 2009 3:18 PM  Refills Requested: Medication #1:  RANITIDINE HCL 150 MG  TABS Take 1 tablet by mouth once daily Initial call taken by: Ami Bullins CMA,  August 10, 2009 3:18 PM    Prescriptions: RANITIDINE HCL 150 MG  TABS (RANITIDINE HCL) Take 1 tablet by mouth once daily  #30 x 6   Entered by:   Ami Bullins CMA   Authorized by:   Jacques Navy MD   Signed by:   Bill Salinas CMA on 08/10/2009   Method used:   Electronically to        [facility] [address] (retail)       [phone]       [place], [place]  [address]       Ph: [phone]       Fax: [phone]   RxID:   [id number]

## 2010-09-15 ENCOUNTER — Emergency Department (HOSPITAL_COMMUNITY): Payer: Medicare Other

## 2010-09-15 ENCOUNTER — Inpatient Hospital Stay (HOSPITAL_COMMUNITY)
Admission: EM | Admit: 2010-09-15 | Discharge: 2010-09-20 | DRG: 811 | Disposition: A | Discharge status: Skilled Nursing Facility | Payer: Medicare Other | Attending: Internal Medicine | Admitting: Internal Medicine

## 2010-09-15 DIAGNOSIS — I129 Hypertensive chronic kidney disease with stage 1 through stage 4 chronic kidney disease, or unspecified chronic kidney disease: Secondary | ICD-10-CM | POA: Diagnosis present

## 2010-09-15 DIAGNOSIS — N189 Chronic kidney disease, unspecified: Secondary | ICD-10-CM | POA: Diagnosis present

## 2010-09-15 DIAGNOSIS — R109 Unspecified abdominal pain: Secondary | ICD-10-CM | POA: Diagnosis present

## 2010-09-15 DIAGNOSIS — E039 Hypothyroidism, unspecified: Secondary | ICD-10-CM | POA: Diagnosis present

## 2010-09-15 DIAGNOSIS — K219 Gastro-esophageal reflux disease without esophagitis: Secondary | ICD-10-CM | POA: Diagnosis present

## 2010-09-15 DIAGNOSIS — I4891 Unspecified atrial fibrillation: Secondary | ICD-10-CM | POA: Diagnosis present

## 2010-09-15 DIAGNOSIS — K5900 Constipation, unspecified: Secondary | ICD-10-CM | POA: Diagnosis not present

## 2010-09-15 DIAGNOSIS — D509 Iron deficiency anemia, unspecified: Principal | ICD-10-CM | POA: Diagnosis present

## 2010-09-15 DIAGNOSIS — Z96659 Presence of unspecified artificial knee joint: Secondary | ICD-10-CM

## 2010-09-15 DIAGNOSIS — J209 Acute bronchitis, unspecified: Secondary | ICD-10-CM | POA: Diagnosis present

## 2010-09-15 DIAGNOSIS — Z96649 Presence of unspecified artificial hip joint: Secondary | ICD-10-CM

## 2010-09-15 DIAGNOSIS — A498 Other bacterial infections of unspecified site: Secondary | ICD-10-CM | POA: Diagnosis present

## 2010-09-15 DIAGNOSIS — N390 Urinary tract infection, site not specified: Secondary | ICD-10-CM | POA: Diagnosis present

## 2010-09-15 DIAGNOSIS — E785 Hyperlipidemia, unspecified: Secondary | ICD-10-CM | POA: Diagnosis present

## 2010-09-15 DIAGNOSIS — M1990 Unspecified osteoarthritis, unspecified site: Secondary | ICD-10-CM | POA: Diagnosis present

## 2010-09-15 DIAGNOSIS — E860 Dehydration: Secondary | ICD-10-CM | POA: Diagnosis present

## 2010-09-15 DIAGNOSIS — K256 Chronic or unspecified gastric ulcer with both hemorrhage and perforation: Secondary | ICD-10-CM | POA: Diagnosis present

## 2010-09-15 DIAGNOSIS — I739 Peripheral vascular disease, unspecified: Secondary | ICD-10-CM | POA: Diagnosis present

## 2010-09-15 DIAGNOSIS — K7689 Other specified diseases of liver: Secondary | ICD-10-CM | POA: Diagnosis present

## 2010-09-15 LAB — URINALYSIS, ROUTINE W REFLEX MICROSCOPIC
Glucose, UA: NEGATIVE mg/dL
Nitrite: POSITIVE — AB
Specific Gravity, Urine: 1.015 (ref 1.005–1.030)
pH: 6 (ref 5.0–8.0)

## 2010-09-15 LAB — URINE MICROSCOPIC-ADD ON

## 2010-09-15 LAB — CBC
MCH: 24.4 pg — ABNORMAL LOW (ref 26.0–34.0)
Platelets: 270 10*3/uL (ref 150–400)
RBC: 2.54 MIL/uL — ABNORMAL LOW (ref 4.22–5.81)
RDW: 17.3 % — ABNORMAL HIGH (ref 11.5–15.5)
WBC: 7.2 10*3/uL (ref 4.0–10.5)

## 2010-09-15 LAB — POCT CARDIAC MARKERS
Myoglobin, poc: 285 ng/mL (ref 12–200)
Troponin i, poc: <0.05 ng/mL (ref 0.00–0.09)

## 2010-09-15 LAB — COMPREHENSIVE METABOLIC PANEL
AST: 21 U/L (ref 0–37)
Albumin: 3.3 g/dL — ABNORMAL LOW (ref 3.5–5.2)
Chloride: 110 mEq/L (ref 96–112)
Creatinine, Ser: 2.07 mg/dL — ABNORMAL HIGH (ref 0.4–1.5)
GFR calc Af Amer: 37 mL/min — ABNORMAL LOW (ref >60)
Total Bilirubin: 1.1 mg/dL (ref 0.3–1.2)
Total Protein: 6.8 g/dL (ref 6.0–8.3)

## 2010-09-15 LAB — FERRITIN: Ferritin: 13 ng/mL — ABNORMAL LOW (ref 22–322)

## 2010-09-15 LAB — MRSA PCR SCREENING: MRSA by PCR: POSITIVE — AB

## 2010-09-15 LAB — DIFFERENTIAL
Basophils Relative: 0 % (ref 0–1)
Eosinophils Absolute: 0 10*3/uL (ref 0.0–0.7)
Monocytes Relative: 2 % — ABNORMAL LOW (ref 3–12)
Neutrophils Relative %: 94 % — ABNORMAL HIGH (ref 43–77)

## 2010-09-16 ENCOUNTER — Telehealth: Payer: Self-pay | Admitting: Internal Medicine

## 2010-09-16 DIAGNOSIS — D509 Iron deficiency anemia, unspecified: Secondary | ICD-10-CM

## 2010-09-16 DIAGNOSIS — E039 Hypothyroidism, unspecified: Secondary | ICD-10-CM

## 2010-09-16 DIAGNOSIS — R195 Other fecal abnormalities: Secondary | ICD-10-CM

## 2010-09-16 DIAGNOSIS — J209 Acute bronchitis, unspecified: Secondary | ICD-10-CM

## 2010-09-16 DIAGNOSIS — N289 Disorder of kidney and ureter, unspecified: Secondary | ICD-10-CM

## 2010-09-16 DIAGNOSIS — D649 Anemia, unspecified: Secondary | ICD-10-CM

## 2010-09-16 DIAGNOSIS — N390 Urinary tract infection, site not specified: Secondary | ICD-10-CM

## 2010-09-16 DIAGNOSIS — K219 Gastro-esophageal reflux disease without esophagitis: Secondary | ICD-10-CM

## 2010-09-16 DIAGNOSIS — E860 Dehydration: Secondary | ICD-10-CM

## 2010-09-16 LAB — HEMOGLOBIN AND HEMATOCRIT, BLOOD: HCT: 27.7 % — ABNORMAL LOW (ref 39.0–52.0)

## 2010-09-16 LAB — CROSSMATCH
ABO/RH(D): O POS
Unit division: 0

## 2010-09-16 LAB — BASIC METABOLIC PANEL
CO2: 20 mEq/L (ref 19–32)
Calcium: 8.7 mg/dL (ref 8.4–10.5)
Chloride: 111 mEq/L (ref 96–112)
GFR calc Af Amer: 33 mL/min — ABNORMAL LOW (ref >60)
Potassium: 3.9 mEq/L (ref 3.5–5.1)
Sodium: 138 mEq/L (ref 135–145)

## 2010-09-16 LAB — LIPID PANEL
LDL Cholesterol: 59 mg/dL (ref 0–99)
Total CHOL/HDL Ratio: 2.6 RATIO
Triglycerides: 74 mg/dL (ref <150)
VLDL: 15 mg/dL (ref 0–40)

## 2010-09-16 LAB — INFLUENZA PANEL BY PCR (TYPE A & B)
H1N1 flu by pcr: NOT DETECTED — AB
Influenza A By PCR: NEGATIVE

## 2010-09-16 LAB — CBC
Hemoglobin: 8.4 g/dL — ABNORMAL LOW (ref 13.0–17.0)
RBC: 3.26 MIL/uL — ABNORMAL LOW (ref 4.22–5.81)
WBC: 7.1 10*3/uL (ref 4.0–10.5)

## 2010-09-17 ENCOUNTER — Telehealth: Payer: Self-pay

## 2010-09-17 ENCOUNTER — Other Ambulatory Visit: Payer: Self-pay | Admitting: Gastroenterology

## 2010-09-17 ENCOUNTER — Telehealth: Payer: Self-pay | Admitting: Gastroenterology

## 2010-09-17 DIAGNOSIS — K259 Gastric ulcer, unspecified as acute or chronic, without hemorrhage or perforation: Secondary | ICD-10-CM

## 2010-09-17 DIAGNOSIS — D649 Anemia, unspecified: Secondary | ICD-10-CM

## 2010-09-17 DIAGNOSIS — K2990 Gastroduodenitis, unspecified, without bleeding: Secondary | ICD-10-CM

## 2010-09-17 DIAGNOSIS — K2970 Gastritis, unspecified, without bleeding: Secondary | ICD-10-CM

## 2010-09-17 DIAGNOSIS — D509 Iron deficiency anemia, unspecified: Secondary | ICD-10-CM

## 2010-09-17 DIAGNOSIS — R195 Other fecal abnormalities: Secondary | ICD-10-CM

## 2010-09-17 LAB — URINE CULTURE: Colony Count: >=100000

## 2010-09-17 NOTE — Telephone Encounter (Signed)
I just finished EGD at [facility] for this patient.  This was only way I know of to communicate this... He needs rov with DR. Kaplan in 5-6 weeks for ulcer follow up.  SHould have a cbc just prior to the visit

## 2010-09-17 NOTE — Telephone Encounter (Signed)
Patient scheduled to see Dr. Arlyce Dice 10/30/10 @2pm . Pt to have cbc prior to visit at 1pm. Patient aware of appt date and time.

## 2010-09-17 NOTE — Telephone Encounter (Signed)
Sent to Selinda Michaels RN for scheduling on Dr Nita Sells  Schedule.

## 2010-09-18 LAB — BASIC METABOLIC PANEL
Chloride: 110 mEq/L (ref 96–112)
GFR calc Af Amer: 43 mL/min — ABNORMAL LOW (ref >60)
Potassium: 4 mEq/L (ref 3.5–5.1)
Sodium: 135 mEq/L (ref 135–145)

## 2010-09-18 LAB — HEMOGLOBIN AND HEMATOCRIT, BLOOD: HCT: 25.7 % — ABNORMAL LOW (ref 39.0–52.0)

## 2010-09-19 LAB — BASIC METABOLIC PANEL
Calcium: 9.2 mg/dL (ref 8.4–10.5)
Creatinine, Ser: 1.48 mg/dL (ref 0.4–1.5)
GFR calc Af Amer: 55 mL/min — ABNORMAL LOW (ref >60)

## 2010-09-19 LAB — HEMOGLOBIN AND HEMATOCRIT, BLOOD
HCT: 27.4 % — ABNORMAL LOW (ref 39.0–52.0)
HCT: 27 % — ABNORMAL LOW (ref 39.0–52.0)
Hemoglobin: 8.7 g/dL — ABNORMAL LOW (ref 13.0–17.0)
Hemoglobin: 8.5 g/dL — ABNORMAL LOW (ref 13.0–17.0)

## 2010-09-19 LAB — MAGNESIUM: Magnesium: 2.4 mg/dL (ref 1.5–2.5)

## 2010-09-20 ENCOUNTER — Telehealth: Payer: Self-pay

## 2010-09-20 NOTE — Telephone Encounter (Signed)
Pt is currently in the hospital and will be given results at that time

## 2010-09-20 NOTE — Telephone Encounter (Signed)
Message copied by Chales Abrahams on Fri Sep 20, 2010  2:12 PM ------      Message from: Rob Bunting      Created: Fri Sep 20, 2010 12:59 PM       Please call him.  Biopsies show no h. Pylori.  Should continue with the suggestions outlined at recent visit.

## 2010-09-21 LAB — CULTURE, BLOOD (ROUTINE X 2)
Culture  Setup Time: 201203181159
Culture: NO GROWTH
Culture: NO GROWTH
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
## 2010-09-25 NOTE — Telephone Encounter (Signed)
Patient is on the rehab unit and they will call to reschedule when the patient is out of the rehab unit.

## 2010-09-26 ENCOUNTER — Encounter: Payer: Self-pay | Admitting: Internal Medicine

## 2010-09-26 NOTE — Progress Notes (Signed)
  Phone Note Refill Request Message from:  Fax from Pharmacy on September 16, 2010 1:57 PM  Refills Requested: Medication #1:  TRAZODONE HCL 50 MG TABS 1 by mouth at bedtime   Last Refilled: 05/31/2010 Please Advise refills  Initial call taken by: Ami Bullins CMA,  September 16, 2010 1:58 PM  Follow-up for Phone Call        ok to refill # 30 x 5 Follow-up by: Jacques Navy MD,  September 16, 2010 6:06 PM    Prescriptions: TRAZODONE HCL 50 MG TABS (TRAZODONE HCL) 1 by mouth at bedtime  #30 x 5   Entered by:   Ami Bullins CMA   Authorized by:   Jacques Navy MD   Signed by:   Bill Salinas CMA on 09/16/2010   Method used:   Electronically to        [facility] (retail)       [address]       [place], [place]  [address]       Ph: [phone]       Fax: [phone]   RxID:   [id number]

## 2010-09-26 NOTE — Procedures (Signed)
Summary: Upper Endoscopy  Patient: [name] Note: All result statuses are Final unless otherwise noted.  Tests: (1) Upper Endoscopy (EGD)   EGD Upper Endoscopy       DONE     [facility]     [address] [place], [place]  [address]          ENDOSCOPY PROCEDURE REPORT          PATIENT:  Zyion, Doxtater  MR#:  [id number]     BIRTHDATE:  Jun 22, 1927, 83 yrs. old  GENDER:  male     ENDOSCOPIST:  Rachael Fee, MD     Referred by:  [name], M.D.     PROCEDURE DATE:  09/17/2010     PROCEDURE:  EGD with biopsy, [address]     ASA CLASS:  Class III     INDICATIONS:  heme pos, IDA, on several NSAIDs daily     MEDICATIONS:  Versed 4 mg IV, Fentanyl 25 mcg IV     TOPICAL ANESTHETIC:  Cetacaine Spray          DESCRIPTION OF PROCEDURE:   After the risks benefits and     alternatives of the procedure were thoroughly explained, informed     consent was obtained.  The  endoscope was introduced through the     mouth and advanced to the second portion of the duodenum, without     limitations.  The instrument was slowly withdrawn as the mucosa     was fully examined.     <<PROCEDUREIMAGES>>     There were five ulcers in stomach. They were all clean based and     not bleeding. Three were in distal stomach and two (the largest     two) were in proximal stomach. None appeared malignant. There was     a background of mild to moderate pan gastritis. This was biopsied     to check for H. pylori (see image2 and image6).  A hiatal hernia     was found (see image3).  Otherwise the examination was normal (see     image1).    Retroflexed views revealed no abnormalities.    The     scope was then withdrawn from the patient and the procedure     completed.     COMPLICATIONS:  None          ENDOSCOPIC IMPRESSION:     1) Gastric ulcers; likely related to NSAIDs, but stomach was     biopsied to check for H. pylori     2) Hiatal hernia     3) Otherwise normal examination         RECOMMENDATIONS:     Should completely avoid NSAIDs and take ASA only if absolutely     needed.     PPI twice daily for 2 months, then OK to decrease to once daily.          My office will arrange repeat office visit with Dr. Arlyce Dice in     6-7 weeks, will probably set up repeat EGD to confirm ulcer     healing from that visit.     If biopsies show H. pylori, he will be started on the     appropriate antibiotics.          ______________________________     Rachael Fee, MD          cc: Melvia Heaps, MD  n.     eSIGNED:   Rachael Fee at 09/17/2010 09:29 AM          Bea Laura, [id number]  Note: An exclamation mark (!) indicates a result that was not dispersed into the flowsheet. Document Creation Date: 09/17/2010 9:30 AM _______________________________________________________________________  (1) Order result status: Final Collection or observation date-time: 09/17/2010 09:21 Requested date-time:  Receipt date-time:  Reported date-time:  Referring Physician:   Ordering Physician: Rob Bunting [phone]) Specimen Source:  Source: Launa Grill Order Number: [id number] Lab site:

## 2010-09-30 NOTE — Discharge Summary (Signed)
NAMEBRANTLEY, [name]                 ACCOUNT NO.:  [id number]  MEDICAL RECORD NO.:  [id number]           PATIENT TYPE:  I  LOCATION:  1313                         FACILITY:  [facility]  PHYSICIAN:  [name], MD  DATE OF BIRTH:  Aug 05, 1926  DATE OF ADMISSION:  09/15/2010 DATE OF DISCHARGE:                              DISCHARGE SUMMARY   ADMISSION DIAGNOSES: 1. Acute bronchitis. 2. Severe normocytic anemia. 3. Urinary tract infection. 4. Hypothyroid disease. 5. History of gastroesophageal reflux disease. 6. Dehydration with acute renal insufficiency. 7. Hypertension.  DISCHARGE DIAGNOSES: 1. Acute bronchitis. 2. Severe normocytic anemia. 3. Urinary tract infection. 4. Hypothyroid disease. 5. History of gastroesophageal reflux disease. 6. Dehydration with acute renal insufficiency. 7. Hypertension.  CONSULTANTS:  Rachael Fee, MD for GI.  PROCEDURES: 1. Chest x-ray, March 18, which showed stable mild left basilar     scarring with no active disease. 2. EGD performed by Dr. Christella Hartigan on September 17, 2010 which revealed     gastric ulcers x 5, likely related to end-stage hernia.     Recommendation was for PPI twice daily for 2 months and then once     daily.  HISTORY OF PRESENT ILLNESS:  [name] is an 77 year old gentleman with multiple medical problems, who is in his usual state of health until a week or so prior to admission where he was becoming progressively weak, having increased shortness of breath and cough productive of scanty phlegm which is a chronic problem.  The patient reported fevers, chills but did not take actual temperature.  He denied chest pain.  The patient had emesis and vomiting but denied abdominal pain.  He had a poor appetite.  He was brought to the Emergency Department for evaluation. Please see the H&P for past medical history, social history and family history.  Admission examination significant for a temperature of 100.9, heart rate was  91, blood pressure 171/90.  Chest was clear.  No wheezes or crackles were heard.  Heart exam was unremarkable.  Abdomen was nontender.  Admission laboratory was significant for hemoglobin 6.2 grams, hematocrit 20.6%, white count 7200.  Electrolytes were normal except for creatinine 2.07, BUN 25.  Fecal occult blood testing was negative. Urinalysis was positive with 11 to 20 wbc's and many bacteria.  HOSPITAL COURSE: 1. Anemia.  Patient with significant anemia.  He did have a normal B12     level.  He did have significant iron deficiency with an iron level     of 16, percent saturation of 6.  It was felt the patient had acute-     on-chronic anemia and was seen in consultation by the GI service     with EGD to follow.  Results as above.  The patient was transfused     2 units of packed red cells with a rise in hemoglobin from 6.2 to     8.7 grams.  This was monitored over time.  His initial hemoglobin     after transfusion was 8.7 grams and over the next several readings,     he hovered around 8.4 with a  nadir of 8.0.  Final lab on the March     22 was a hemoglobin at 4 p.m. of 8.5 grams.  The patient had no     signs of active bleeding.  He is thought to be stable in regards to     his anemia.  Plan will be to continue proton pump inhibitors b.i.d.     for 2 months.  He will continue on iron replacement orally. 2. ID.  The patient was initially started on broad-spectrum     antibiotics including Rocephin and azithromycin.  Given absence of     a leukocytosis and negative chest x-ray with a mildly positive UA,     he was switched to Septra DS initially b.i.d. but on advice of     pharmacy, given the patient's age and renal appearance, it was     changed to 1 tablet daily.  On this regimen, the patient seemed to     do well.  He is thought to have completed a full course of     antibiotics by the time of discharge. 3. Acute renal insufficiency.  The patient's initial creatinine at      admission was 2.07.  Final creatinine on March 22 was 1.48 and at     his baseline.  He had been corrected with hydration and     transfusion.  No additional evaluation is needed. 4. Constipation.  The patient did complain of constipation and was     given milk of magnesia with good results. 5. MSK.  The patient has chronic back pain, chronic leg pain secondary     to vascular disease and during this admission complained of severe     groin pain.  His examination, however, revealed good range of     motion of his hip.  He was able to stand and bear weight.  The     patient had been getting oxycodone and this was continued.  The     patient's anemia being stabilized with his infection being treated     with renal insufficiency being back to normal.  He is medically     stable and ready for transfer.  The patient was seen by physical     therapy during this admission and was felt to be in need of either     24-hour day care or short-term skilled care.  The latter was     arranged.  The patient at this time will be transferred to a     skilled care facility.  DISCHARGE EXAMINATION:  VITAL SIGNS:  Temperature of 98.7, blood pressure 166/71, pulse 77, respirations 16, O2 sats 96% on room air. GENERAL APPEARANCE:  This is an elderly African-American gentleman who was sitting in a chair, who appears to be comfortable and does not complain of pain. HEENT:  Exam was unremarkable. NECK:  Supple. CHEST:  Patient is moving air well.  I appreciated no rales or wheezes. CARDIOVASCULAR:  Quiet precordium was noted.  His heart rate was regular. ABDOMEN:  The patient had positive bowel sounds.  His abdomen was soft. No guarding or rebound to palpation. EXTREMITIES:  Without clubbing, cyanosis or edema.  No further examination performed at discharge.  DISPOSITION:  The patient to go to skilled care for short-term rehab. He will continue on medications per the medication reconciliation manager.  Of  note, he will need to be on a proton pump inhibitor b.i.d. for 2 months.  He has completed a  course of antibiotics.  The patient will be attended by facility physician, be happy to see this patient in the office following his discharge from rehab and he will need to call the office for appointment.  The patient's condition at the time of discharge dictation is medically stable.     Rosalyn Gess Norins, MD     MEN/MEDQ  D:  09/20/2010  T:  09/20/2010  Job:  [id number]  Electronically Signed by Illene Regulus MD on 09/30/2010 09:03:15 AM

## 2010-10-06 LAB — CBC
HCT: 27 % — ABNORMAL LOW (ref 39.0–52.0)
HCT: 30.4 % — ABNORMAL LOW (ref 39.0–52.0)
Hemoglobin: 9.3 g/dL — ABNORMAL LOW (ref 13.0–17.0)
MCHC: 34.5 g/dL (ref 30.0–36.0)
MCHC: 34.6 g/dL (ref 30.0–36.0)
MCV: 102.1 fL — ABNORMAL HIGH (ref 78.0–100.0)
Platelets: 186 10*3/uL (ref 150–400)
Platelets: 208 10*3/uL (ref 150–400)
RDW: 18 % — ABNORMAL HIGH (ref 11.5–15.5)
WBC: 4.5 10*3/uL (ref 4.0–10.5)

## 2010-10-06 LAB — BASIC METABOLIC PANEL
BUN: 18 mg/dL (ref 6–23)
BUN: 21 mg/dL (ref 6–23)
BUN: 21 mg/dL (ref 6–23)
BUN: 21 mg/dL (ref 6–23)
CO2: 25 mEq/L (ref 19–32)
CO2: 25 mEq/L (ref 19–32)
CO2: 25 mEq/L (ref 19–32)
Calcium: 8.7 mg/dL (ref 8.4–10.5)
Calcium: 8.9 mg/dL (ref 8.4–10.5)
Chloride: 104 mEq/L (ref 96–112)
Creatinine, Ser: 1.52 mg/dL — ABNORMAL HIGH (ref 0.4–1.5)
Creatinine, Ser: 1.63 mg/dL — ABNORMAL HIGH (ref 0.4–1.5)
GFR calc non Af Amer: 44 mL/min — ABNORMAL LOW (ref >60)
Glucose, Bld: 102 mg/dL — ABNORMAL HIGH (ref 70–99)
Glucose, Bld: 81 mg/dL (ref 70–99)
Glucose, Bld: 91 mg/dL (ref 70–99)
Glucose, Bld: 96 mg/dL (ref 70–99)
Potassium: 3.1 mEq/L — ABNORMAL LOW (ref 3.5–5.1)
Potassium: 3.6 mEq/L (ref 3.5–5.1)
Sodium: 136 mEq/L (ref 135–145)
Sodium: 139 mEq/L (ref 135–145)

## 2010-10-06 LAB — CK: Total CK: 237 U/L — ABNORMAL HIGH (ref 7–232)

## 2010-10-06 LAB — AMYLASE: Amylase: 322 U/L — ABNORMAL HIGH (ref 27–131)

## 2010-10-07 LAB — BASIC METABOLIC PANEL
BUN: 46 mg/dL — ABNORMAL HIGH (ref 6–23)
CO2: 17 mEq/L — ABNORMAL LOW (ref 19–32)
CO2: 19 mEq/L (ref 19–32)
Calcium: 9.6 mg/dL (ref 8.4–10.5)
GFR calc Af Amer: 48 mL/min — ABNORMAL LOW (ref >60)
Glucose, Bld: 111 mg/dL — ABNORMAL HIGH (ref 70–99)
Potassium: 4.1 mEq/L (ref 3.5–5.1)
Potassium: 4.4 mEq/L (ref 3.5–5.1)
Sodium: 135 mEq/L (ref 135–145)
Sodium: 140 mEq/L (ref 135–145)

## 2010-10-07 LAB — URINE MICROSCOPIC-ADD ON

## 2010-10-07 LAB — POCT I-STAT, CHEM 8
BUN: 50 mg/dL — ABNORMAL HIGH (ref 6–23)
Calcium, Ion: 1.06 mmol/L — ABNORMAL LOW (ref 1.12–1.32)
Chloride: 105 meq/L (ref 96–112)
Creatinine, Ser: 2.1 mg/dL — ABNORMAL HIGH (ref 0.4–1.5)
Glucose, Bld: 107 mg/dL — ABNORMAL HIGH (ref 70–99)
HCT: 42 % (ref 39.0–52.0)
Hemoglobin: 14.3 g/dL (ref 13.0–17.0)
Potassium: 4.5 meq/L (ref 3.5–5.1)
Sodium: 133 meq/L — ABNORMAL LOW (ref 135–145)
TCO2: 16 mmol/L (ref 0–100)

## 2010-10-07 LAB — HEPATIC FUNCTION PANEL
ALT: 54 U/L — ABNORMAL HIGH (ref 0–53)
Albumin: 3.7 g/dL (ref 3.5–5.2)
Indirect Bilirubin: 0.8 mg/dL (ref 0.3–0.9)
Total Protein: 7.3 g/dL (ref 6.0–8.3)

## 2010-10-07 LAB — POCT CARDIAC MARKERS
CKMB, poc: 6.4 ng/mL (ref 1.0–8.0)
Myoglobin, poc: >500 ng/mL (ref 12–200)
Troponin i, poc: <0.05 ng/mL (ref 0.00–0.09)

## 2010-10-07 LAB — POCT I-STAT 3, ART BLOOD GAS (G3+)
Acid-base deficit: 5 mmol/L — ABNORMAL HIGH (ref 0.0–2.0)
Acid-base deficit: 7 mmol/L — ABNORMAL HIGH (ref 0.0–2.0)
Bicarbonate: 15.9 meq/L — ABNORMAL LOW (ref 20.0–24.0)
O2 Saturation: 90 %
O2 Saturation: 97 %
Patient temperature: 99.2
Patient temperature: 99.2
TCO2: 17 mmol/L (ref 0–100)
pCO2 arterial: 25.2 mmHg — ABNORMAL LOW (ref 35.0–45.0)
pH, Arterial: 7.409 (ref 7.350–7.450)
pO2, Arterial: 58 mmHg — ABNORMAL LOW (ref 80.0–100.0)
pO2, Arterial: 88 mmHg (ref 80.0–100.0)

## 2010-10-07 LAB — URINALYSIS, ROUTINE W REFLEX MICROSCOPIC
Bilirubin Urine: NEGATIVE
Glucose, UA: NEGATIVE mg/dL
Ketones, ur: NEGATIVE mg/dL
Leukocytes, UA: NEGATIVE
Nitrite: NEGATIVE
Protein, ur: 100 mg/dL — AB
Specific Gravity, Urine: 1.021 (ref 1.005–1.030)
Urobilinogen, UA: 0.2 mg/dL (ref 0.0–1.0)
pH: 5 (ref 5.0–8.0)

## 2010-10-07 LAB — COMPREHENSIVE METABOLIC PANEL
AST: 62 U/L — ABNORMAL HIGH (ref 0–37)
Albumin: 3.6 g/dL (ref 3.5–5.2)
CO2: 19 mEq/L (ref 19–32)
Calcium: 10 mg/dL (ref 8.4–10.5)
Creatinine, Ser: 1.83 mg/dL — ABNORMAL HIGH (ref 0.4–1.5)
GFR calc Af Amer: 43 mL/min — ABNORMAL LOW (ref >60)
GFR calc non Af Amer: 36 mL/min — ABNORMAL LOW (ref >60)
Total Protein: 7.3 g/dL (ref 6.0–8.3)

## 2010-10-07 LAB — CK TOTAL AND CKMB (NOT AT ARMC)
CK, MB: 6.6 ng/mL — ABNORMAL HIGH (ref 0.3–4.0)
Relative Index: 2.1 (ref 0.0–2.5)
Total CK: 312 U/L — ABNORMAL HIGH (ref 7–232)

## 2010-10-07 LAB — CBC
HCT: 36.5 % — ABNORMAL LOW (ref 39.0–52.0)
Hemoglobin: 12.4 g/dL — ABNORMAL LOW (ref 13.0–17.0)
MCHC: 33.8 g/dL (ref 30.0–36.0)
MCHC: 33.9 g/dL (ref 30.0–36.0)
MCV: 101.2 fL — ABNORMAL HIGH (ref 78.0–100.0)
MCV: 102.1 fL — ABNORMAL HIGH (ref 78.0–100.0)
Platelets: 128 10*3/uL — ABNORMAL LOW (ref 150–400)
Platelets: 129 K/uL — ABNORMAL LOW (ref 150–400)
RBC: 3.61 MIL/uL — ABNORMAL LOW (ref 4.22–5.81)
RDW: 18.3 % — ABNORMAL HIGH (ref 11.5–15.5)
RDW: 18.7 % — ABNORMAL HIGH (ref 11.5–15.5)
WBC: 4.9 K/uL (ref 4.0–10.5)

## 2010-10-07 LAB — CARDIAC PANEL(CRET KIN+CKTOT+MB+TROPI)
CK, MB: 7.4 ng/mL — ABNORMAL HIGH (ref 0.3–4.0)
Relative Index: 1.6 (ref 0.0–2.5)
Total CK: 386 U/L — ABNORMAL HIGH (ref 7–232)
Total CK: 424 U/L — ABNORMAL HIGH (ref 7–232)
Total CK: 458 U/L — ABNORMAL HIGH (ref 7–232)
Troponin I: 0.03 ng/mL (ref 0.00–0.06)
Troponin I: 0.03 ng/mL (ref 0.00–0.06)
Troponin I: 0.04 ng/mL (ref 0.00–0.06)

## 2010-10-07 LAB — LIPASE, BLOOD: Lipase: 758 U/L — ABNORMAL HIGH (ref 11–59)

## 2010-10-07 LAB — DIFFERENTIAL
Basophils Absolute: 0 10*3/uL (ref 0.0–0.1)
Basophils Relative: 0 % (ref 0–1)
Eosinophils Absolute: 0 10*3/uL (ref 0.0–0.7)
Eosinophils Relative: 0 % (ref 0–5)
Monocytes Absolute: 0.5 10*3/uL (ref 0.1–1.0)

## 2010-10-07 LAB — POCT I-STAT 3, VENOUS BLOOD GAS (G3P V)
TCO2: 16 mmol/L (ref 0–100)
pH, Ven: 7.384 — ABNORMAL HIGH (ref 7.250–7.300)

## 2010-10-07 LAB — LIPID PANEL
HDL: 116 mg/dL (ref >39)
Total CHOL/HDL Ratio: 1.5 RATIO
VLDL: 10 mg/dL (ref 0–40)

## 2010-10-07 LAB — AMMONIA: Ammonia: 18 umol/L (ref 11–35)

## 2010-10-07 LAB — RAPID URINE DRUG SCREEN, HOSP PERFORMED: Barbiturates: NOT DETECTED

## 2010-10-07 LAB — ETHANOL

## 2010-10-10 LAB — DIFFERENTIAL
Basophils Absolute: 0 10*3/uL (ref 0.0–0.1)
Basophils Relative: 1 % (ref 0–1)
Eosinophils Absolute: 0 10*3/uL (ref 0.0–0.7)
Eosinophils Relative: 0 % (ref 0–5)
Lymphocytes Relative: 11 % — ABNORMAL LOW (ref 12–46)
Lymphs Abs: 1 10*3/uL (ref 0.7–4.0)
Monocytes Absolute: 1.6 10*3/uL — ABNORMAL HIGH (ref 0.1–1.0)
Monocytes Relative: 16 % — ABNORMAL HIGH (ref 3–12)
Neutro Abs: 7.1 10*3/uL (ref 1.7–7.7)
Neutrophils Relative %: 73 % (ref 43–77)

## 2010-10-10 LAB — URINALYSIS, ROUTINE W REFLEX MICROSCOPIC
Ketones, ur: NEGATIVE mg/dL
Nitrite: NEGATIVE
Protein, ur: 100 mg/dL — AB
pH: 5.5 (ref 5.0–8.0)

## 2010-10-10 LAB — URINE MICROSCOPIC-ADD ON

## 2010-10-10 LAB — URINE CULTURE: Colony Count: >=100000

## 2010-10-10 LAB — CBC
RBC: 3.27 MIL/uL — ABNORMAL LOW (ref 4.22–5.81)
WBC: 9.8 10*3/uL (ref 4.0–10.5)

## 2010-10-10 LAB — BASIC METABOLIC PANEL
Calcium: 8.8 mg/dL (ref 8.4–10.5)
Creatinine, Ser: 2.34 mg/dL — ABNORMAL HIGH (ref 0.4–1.5)
GFR calc Af Amer: 33 mL/min — ABNORMAL LOW (ref >60)
GFR calc non Af Amer: 27 mL/min — ABNORMAL LOW (ref >60)

## 2010-10-21 ENCOUNTER — Encounter: Payer: Self-pay | Admitting: Internal Medicine

## 2010-10-21 ENCOUNTER — Ambulatory Visit (INDEPENDENT_AMBULATORY_CARE_PROVIDER_SITE_OTHER): Payer: Medicare Other | Admitting: Internal Medicine

## 2010-10-21 ENCOUNTER — Other Ambulatory Visit (INDEPENDENT_AMBULATORY_CARE_PROVIDER_SITE_OTHER): Payer: Medicare Other

## 2010-10-21 DIAGNOSIS — D649 Anemia, unspecified: Secondary | ICD-10-CM

## 2010-10-21 DIAGNOSIS — I10 Essential (primary) hypertension: Secondary | ICD-10-CM

## 2010-10-21 DIAGNOSIS — K219 Gastro-esophageal reflux disease without esophagitis: Secondary | ICD-10-CM

## 2010-10-21 LAB — COMPREHENSIVE METABOLIC PANEL
CO2: 22 mEq/L (ref 19–32)
Creatinine, Ser: 1.5 mg/dL (ref 0.4–1.5)
GFR: 57.17 mL/min — ABNORMAL LOW (ref >60.00)
Glucose, Bld: 93 mg/dL (ref 70–99)
Total Bilirubin: 0.7 mg/dL (ref 0.3–1.2)

## 2010-10-21 MED ORDER — CLOPIDOGREL BISULFATE 75 MG PO TABS: 75.0000 mg | ORAL_TABLET | Freq: Every day | ORAL | Status: AC

## 2010-10-21 MED ORDER — ZOLPIDEM TARTRATE 5 MG PO TABS: 5.0000 mg | ORAL_TABLET | Freq: Every evening | ORAL | Status: AC | PRN

## 2010-10-21 NOTE — Progress Notes (Signed)
Subjective:    Patient ID: [name], male    DOB: 21-Aug-1926, 77 y.o.   MRN: [id number]  HPI Mr. Recker was hospitalize March 18 - March 23, all records reviewed,  for acute anemia with Hgb of 6. He underwent EGD which revealed 5 gastric ulcers. He also was treated for a respiratory infection and lower extremity pain due to PVD. He was transferred to a SNF for two weeks and has been home since. He has had no stomach pain, dark stool due to iron. He has not had follow up lab.   He continues to have some leg cramps. He has not been back to vascular for evaluation. He does have a callus on the foot which needs to be looked at. He does have progressive arthritic pain but is not on NSAIDs due to GI bleed.   Past Medical History  Diagnosis Date  . Anemia   . Hypertension   . Alcohol abuse   . COPD (chronic obstructive pulmonary disease)   . PVD (peripheral vascular disease)   . Alcoholic pancreatitis 11/2006, 02/3789  . Rib fracture   . Osteomyelitis of foot 05/2008    Right   Past Surgical History  Procedure Date  . Total hip arthroplasty 2003  . Total knee arthroplasty 12/02    Left  . Toe amputation 10/2002    Great toe  . Femoral-popliteal bypass graft 2004  . Partial ray amputation right foot, 1st metatarsal 12/09   Family History  Problem Relation Age of Onset  . Stroke Mother   . Lung cancer Sister   . Throat cancer Brother    History   Social History  . Marital Status: Widowed    Spouse Name: N/A    Number of Children: N/A  . Years of Education: N/A   Occupational History  . Not on file.   Social History Main Topics  . Smoking status: Former Games developer  . Smokeless tobacco: Not on file   Comment: Quit in late 60's  . Alcohol Use: No     History of heavy drinking  . Drug Use: Not on file  . Sexually Active: Not on file   Other Topics Concern  . Not on file   Social History Narrative  . No narrative on file        Review of Systems  Constitutional:  Negative for fever, chills and activity change.  HENT: Positive for neck pain. Negative for congestion, neck stiffness and sinus pressure.   Eyes: Negative for photophobia, pain, discharge and redness.  Respiratory: Negative for apnea, cough, shortness of breath and wheezing.   Cardiovascular: Negative for chest pain and leg swelling.  Gastrointestinal: Negative.   Genitourinary: Negative.   Musculoskeletal: Negative for back pain, joint swelling and arthralgias.  Skin: Negative.  Negative for pallor and wound.  Neurological: Negative for weakness, numbness and headaches.  Hematological: Negative for adenopathy. Does not bruise/bleed easily.  Psychiatric/Behavioral: Negative.        Objective:   Physical Exam Tall, lanky/gaunt AA male in no distress Chest - CTAP Cor - RRR Ext - right foot missing great toe and 2nd toe; hammer toe deformity remaining toes; large callus plantar aspect with a "hole" 3mm under the 3rd metatarsal with no exudate or drainage. There is minimal tenderness. Neuro - A&O x 3, CN II-XII intact, speech clear.          Assessment & Plan:  1. Anemia - D/C Hgb 8.6g. For follow-up lab with recommendations  to follow. He is to continue iron replacement.  2. Hypertension - suboptimal control. At d/c ARB/hct was stopped due to renal insufficiency and amlodipine was substituted.  Plan - Bmet today. Adjust meds at next visit.  3. Renal insufficiency - for lab today. Will stop calcitriol and nehpro-vits.  4. Gastritis/NSAID ulcers - he is to continue on protonix BID. Instructed to stop ranitidine, meloxicam, advil, ASA 325.  5. PVD - stable callus with severe PVD.   Plan - will resume plavix - he will not be able to afford until May 3rd.  6. Chronic pain management - will continue sertraline 50mg  qAM, oxycontin 10mg  bid, oxycodine 5mg  q4 prn.  7. Sleep - will continue trazodone 50mg  qhs and new Rx for zolpidem 5mg  qhs prn.  8. Polypharmacy - spent 30 minutes  clarifying and organizing his meds: he had duplicates from SNF stay, meds he should not be taking and in need of two prescriptions. Will have home health set up medication box using todays med list.

## 2010-10-21 NOTE — Patient Instructions (Addendum)
See list of medications below: this is an accurate list as of today. We need to be sure your medication box is filled correctly. New prescriptions are sent in for the medications you don't have. Blood pressure - 158/80 today which is a little high. Will continue amlodipine 10mg  once a day. Circulation - the right foot looks OK - the callus is not too large. There is no sign of infection today and there is no drainage today. For pain - you can take the oxycontin 10mg  twice a day routinely to control pain. The oxycodone 5mg  is for breakthrough pain.  For sleep - you take the trazodone at bedtime and may also take the zolpidem 5mg  as needed. For anemia - Do not take advil, full-strength aspirin or meloxicam. Take the ferrous gluconate. For digestion - take two creon three times a day - usually before meals.

## 2010-10-22 LAB — CBC WITH DIFFERENTIAL/PLATELET
Hemoglobin: 9.9 g/dL — ABNORMAL LOW (ref 13.0–17.0)
MCHC: 32.4 g/dL (ref 30.0–36.0)
MCV: 85.5 fl (ref 78.0–100.0)
RBC: 3.57 Mil/uL — ABNORMAL LOW (ref 4.22–5.81)

## 2010-10-26 ENCOUNTER — Encounter: Payer: Self-pay | Admitting: Internal Medicine

## 2010-10-30 ENCOUNTER — Ambulatory Visit: Payer: Medicare Other | Admitting: Gastroenterology

## 2010-10-30 ENCOUNTER — Other Ambulatory Visit: Payer: Medicare Other

## 2010-11-12 ENCOUNTER — Encounter (INDEPENDENT_AMBULATORY_CARE_PROVIDER_SITE_OTHER): Payer: Medicare Other

## 2010-11-12 DIAGNOSIS — Z48812 Encounter for surgical aftercare following surgery on the circulatory system: Secondary | ICD-10-CM

## 2010-11-12 DIAGNOSIS — I739 Peripheral vascular disease, unspecified: Secondary | ICD-10-CM
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
## 2010-11-12 NOTE — Procedures (Signed)
BYPASS GRAFT EVALUATION   INDICATION:  Follow-up right fem-pop bypass graft.   HISTORY:  Diabetes:  Yes  Cardiac:  No  Hypertension:  Yes  Smoking:  No  Previous Surgery:  Right fem-pop bypass graft on October 18, 2002.   SINGLE LEVEL ARTERIAL EXAM                               RIGHT              LEFT  Brachial:                    140                142  Anterior tibial:             78                 91  Posterior tibial:            83                 94  Peroneal:  Ankle/brachial index:        0.58               0.66   PREVIOUS ABI:  Date: April 27, 2009  RIGHT:  0.82  LEFT:  0.53   LOWER EXTREMITY BYPASS GRAFT DUPLEX EXAM:   DUPLEX:  1. Patent right femoral-pop bypass graft with no evidence of recurrent      stenosis with biphasic waveforms proximal, mid and distal graft.  2. Increased velocity up to 309 cm/sec noted in the right native      outflow artery.   IMPRESSION:  1. Right ankle brachial index suggests moderate arterial disease with      monophasic waveforms.  2. Left ankle brachial index suggests moderate arterial disease with      monophasic waveforms.  3. Patent right fem-pop bypass graft with elevated velocities in the      native outflow artery as described above.   ___________________________________________  Larina Earthly, M.D.   NT/MEDQ  D:  11/09/2009  T:  11/09/2009  Job:  [id number]

## 2010-11-12 NOTE — Procedures (Signed)
BYPASS GRAFT EVALUATION   INDICATION:  Followup lower extremity bypass graft evaluation   HISTORY:  Diabetes:  yes  Cardiac:  no  Hypertension:  yes  Smoking:  no  Previous Surgery:  Right femoral popliteal bypass graft on 10/18/2002   SINGLE LEVEL ARTERIAL EXAM                               RIGHT              LEFT  Brachial:                    163                161  Anterior tibial:             133                92  Posterior tibial:            87                 108  Peroneal:  Ankle/brachial index:        0.82               0.66   PREVIOUS ABI:  Date: 07/27/2008  RIGHT:  0.53  LEFT:  0.53   LOWER EXTREMITY BYPASS GRAFT DUPLEX EXAM:   DUPLEX:  Patent right femoral popliteal bypass graft with no evidence of  focal stenosis however there are elevated velocities of 180 cm / sec at  outflow level   IMPRESSION:  1. Bilateral ankle brachial indices appear with monophasic waveforms.      There is moderate disease in the right leg.  There is moderate      disease in the left leg.  2. Patent right femoral popliteal bypass graft with elevated      velocities at outflow of 180 cm/ sec.        ___________________________________________  Larina Earthly, M.D.   CB/MEDQ  D:  04/27/2009  T:  04/28/2009  Job:  [id number]

## 2010-11-12 NOTE — Assessment & Plan Note (Signed)
OFFICE VISIT   JGUADALUPE, OPIELA  DOB:  09-15-1926                                       04/27/2009  HWEXH#:[id number]   Patient presents today for continued follow-up of lower extremity  arterial insufficiency.  He is well-known to me from a prior right fem-  pop bypass for limb salvage in 2004.  At that time, he presented with  gangrene of his great toe and underwent bypass and toe amputation with  eventual healing.  He continues to do well.  He does have some  disabilities at his age of 35, most related to arthritic problems.  He  is concerned regarding some night cramping and reports that he takes  vinegar and has some improvement with this.  He denies any calf  claudication.  He walks with the use of a cane or a walker.   He has multiple positive review of systems with shortness of breath on  exertion, constipation, vascular noted for leg pain.  Neurologic:  He  denies dizziness, blackouts, or headaches.  Musculoskeletal:  Does have  arthritic joint pain.  He does have depression and anxiety.  He has some  weight fluctuation and loss of appetite.   PHYSICAL EXAMINATION:  A well-developed, thin black male appearing  stated age of 5.  Blood pressure is 165/76, pulse 98, respirations 19,  temperature is 97.9.  His lower extremities are noted for no evidence of  ulceration.  He has a well-healed toe amputation on the right.  He does  have 2+ right popliteal pulse and has absent popliteal and distal pulses  on the left.   He underwent noninvasive vascular laboratory studies in our office  today, and I have reviewed this with Mr. Varkey and his family present.  He has a patent fem-pop with no evidence of stenosis throughout his  graft with an ankle-arm index of 0.82.  On the left, his ankle-arm index  is 0.66.  I discussed the significance of this with Mr. Simona Huh.  I  explained this his cramping, since it is at nighttime and not related to  exercise is not  related to arterial insufficiency.  I explained that he  does not had any evidence of limb-threatening ischemia currently.  He  will continue his usual activity, and then we will see him in 15-month  protocol visit.   Larina Earthly, M.D.  Electronically Signed   TFE/MEDQ  D:  04/27/2009  T:  04/30/2009  Job:  [id number]   cc:   [name], MD

## 2010-11-12 NOTE — Discharge Summary (Signed)
[name], WALN NO.:  [id number]   MEDICAL RECORD NO.:  [id number]          PATIENT TYPE:  INP   LOCATION:  1612                         [facility]ACILITY[facility]   PHYSICIAN:  [name], MD  DATE OF BIRTH:  [birth year]   DATE OF ADMISSION:  05/29/2008  DATE OF DISCHARGE:  06/09/2008                               DISCHARGE SUMMARY   ADMITTING DIAGNOSES:  1. Cellulitis, right lower extremity.  2. History of pancreatitis, stable.  3. Hypertension.  4. Anemia.  5. History of alcohol abuse.  6. Acute renal insufficiency  7. Gastroesophageal reflux disease.  8. Hypothyroid disease.  9. Hyperlipidemia.   DISCHARGE DIAGNOSES:  1. Osteomyelitis of right foot with methicillin-resistant      Staphylococcus aureus requiring IV antibiotics.  2. History of pancreatitis, stable.  3. Hypertension.  4. Anemia.  5. History of alcohol abuse.  6. Acute renal insufficiency  7. Gastroesophageal reflux disease.  8. Hypothyroid disease.  9. Hyperlipidemia.   CONSULTANT:  Almedia Balls. Ranell Patrick, M.D. for orthopedic surgery.   PROCEDURES:  1. Diagnostic x-ray, right foot, November 30, which showed      postsurgical changes with no acute bony abnormality.  2. MR of the right lower extremity.  Final impression was      osteomyelitis of the residual base of the first metatarsal with      interosseous abscess and draining sinus tract.  Associated      cellulitis and myositis is noted.  3. Chest x-ray, December 7.  Confirmation of right PICC line in the      tip of the lower SVC with no pneumothorax.  4. X-ray of the right foot, December 9, showed further resection of      the first metatarsal.   OPERATIVE PROCEDURE:  Incision and drainage of the right foot with  removal of a portion of the first metatarsal performed June 03, 2008  by Dr. Storm Frisk.  Please see hospital operative report dictation.   HISTORY OF PRESENT ILLNESS:  [name] is a 77 year old male with a  history of peripheral vascular disease who presents with a complaint of  right foot pulsing and swelling which started 6 days prior to admission.  The patient reports difficulty with ambulation.  He reports pain that  was not radiating up his leg.  He had had chills and fever.  The patient  was subsequently admitted with cellulitis of the right foot.   PAST MEDICAL HISTORY:  1. Hyperlipidemia.  2. Hypertension.  3. Peripheral vascular disease.  4. Chronic right lower extremity swelling.  5. Osteoarthritis.  6. Anemia.  7. Pancreatitis.  8. History of alcohol abuse.  9. Degenerative joint disease.  10.GERD.  11.Hypothyroid disease.  12.Acute renal insufficiency in the past.  13.Thrombocytopenia in the past.   PAST SURGICAL HISTORY:  1. Right total hip replacement.  2. Left TKR.  3. Right great toe amputation 2004.  4. Right second toe amputation in July 2006.  5. A fem-pop bypass in 2004.   SOCIAL HISTORY:  The patient lives with son, he is widowed.  He  is  retired from [employer].  Quit smoking remotely.  Positive for alcohol  abuse, although he has not had a drink for several months.   FAMILY HISTORY:  Significant for diabetes and CVA.   MEDICATIONS AT ADMISSION:  1. Avalide 300/25 daily.  2. Aspirin 81 mg daily.  3. Amlodipine 10 mg daily.  4. Ambien 10 mg q.h.s.  5. Iron 325 mg daily.  6. Ranitidine 150 mg b.i.d.  7. Creon 12,000 units, 2 tablets before meals.  8. Meloxicam 15 mg daily.  9. Plavix 75 mg daily.  10.Multivitamins.   PHYSICAL EXAM AT ADMISSION:  Significant for a temperature of 102.2.  HEENT EXAM, CARDIOVASCULAR AND RESPIRATORY EXAMS:  Unremarkable.  ABDOMEN:  Soft and nontender.  EXTREMITIES: The patient had erythema of the right foot with swelling  and tenderness.   HOSPITAL COURSE:  1. ID:  The patient was admitted with cellulitis and started on IV      antibiotics.  He was covered with Zosyn and vancomycin.  The      patient had MRI that  revealed osteomyelitis as noted.  Orthopedic      surgery was consulted.  The patient was taken to the operating room      for debridement.  Infectious disease, Dr. Cliffton Asters, was also      consulted for duration of therapy.  Patient with culture positive      for methicillin-resistant staph aureus of the right foot.  He will      require 4-6 weeks of IV antibiotics with vancomycin.  The patient      has been stable on vancomycin.  His fevers have resolved.  White      count had resolved as of December 6 with a white count of 8900.  At      this point the patient is stable for transfer to a skilled care      facility for ongoing IV antibiotic therapy.  He had PICC line which      has been placed as well as physical therapy to restrain him for      gait and balance after further operation on his foot.  2. Pancreatitis:  The patient has remained stable with no abdominal      pain during his hospital stay.  3. Hypertension:  The patient's blood pressure been adequately      controlled.  4. Anemia:  The patient has been stable with last hemoglobin being 9.9      g, and this is his normal baseline.  5. EtOH abuse:  The patient has had no problems with withdrawal, he      has been doing well.  6. Acute renal insufficiency:  Patient has been stable.  Last      metabolic panel December 6 showed a creatinine 1.31, which is the      patient's baseline.  7. GERD:  Stable on his present medical regimen.  8. Hypothyroid disease:  Last TSH December 1 was normal at 0.335.   DISCHARGE EXAMINATION:  Temperature was 98.4, blood pressure 143/73,  heart rate 83, respirations 17.  GENERAL APPEARANCE:  This a pleasant elderly African American gentleman  lying in bed in no acute distress.  HEENT EXAM:  Normocephalic, atraumatic.  Conjunctivae and sclerae were  clear.  Oropharynx without lesions.  NECK:  Supple.  No thyromegaly was appreciated.  CHEST:  The patient is moving air well with no rales or  wheezes, no  increased work of  breathing.  CARDIOVASCULAR:  The patient had a quiet precordium, he had a regular  rate and rhythm without murmurs.  ABDOMEN:  Soft.  He had positive bowel sounds in all 4 quadrants.  No  guarding or rebound, no tenderness on exam.  GENITALIA AND RECTAL EXAMS:  Deferred.  EXTREMITIES: The patient's right foot is bandaged and this was not  removed.  No other deformities or abnormalities on his extremities were  noted.  NEUROLOGIC EXAM:  The patient is awake, alert, oriented to person,  place, time and context.   FINAL LAB REPORTS:  Patient with a few methicillin-resistant staph  aureus on wound culture.  Anaerobic cultures were negative.  Last  vancomycin trough December 7 was 15.3.  Final BMP December 6 with a  sodium of 137, potassium 4, chloride 108, CO2 of 19, glucose 91, BUN 17,  creatinine 1.31.  Final CBC December 6 with a white count of 8900,  hemoglobin 9.9 g, platelet count 308,000, Folate from December 1 was  supra-normal at 1185.  Iron level was slightly low at 34, ferritin was  78.  B12 was normal at 384.  Lipid panel from December 1 with a  cholesterol 141, triglycerides 107, HDL 43, LDL was 77.   DISCHARGE MEDICATIONS:  1. Aspirin 81 mg daily.  2. Amlodipine 10 mg daily.  3. Zolpidem 5 mg q.h.s.  4. Ferrous sulfate 325 mg daily.  5. Famotidine 20 mg b.i.d.  6. Pancrease/Creon 2 capsules before meals.  7. Plavix 75 mg daily.  8. Multivitamin daily.  9. The patient should continue with subcu heparin 5000 units q. 8.  10.MiraLax 17 g daily.  11.Vancomycin 750 mg IV q. 12.  12.Acetaminophen 650 mg q. 4 p.r.n.  13.Senokot q.h.s. p.r.n.  14.Vicodin 5/325 one p.o. q. 4 p.r.n.   DISPOSITION:  The patient is discharged to a skilled care facility.  He  will need to continue on IV antibiotic therapy for a minimum of 4 weeks  from the date of transfer.  The patient will need PT and OT  consultations and treatment for gait training.  The  patient may follow  all nursing home protocols and procedures.  Code status:  The patient is  a full code.   The patient will see Dr. Debby Bud in followup on an as-needed basis.   The patient's condition at the time of discharge dictation is medically  stable, ready for transfer to skilled care facility.      Rosalyn Gess Norins, MD  Electronically Signed     MEN/MEDQ  D:  06/09/2008  T:  06/09/2008  Job:  [id number]

## 2010-11-12 NOTE — Assessment & Plan Note (Signed)
OFFICE VISIT   [name], [name]  DOB:  May 09, 1927                                       11/09/2009  ZOXWR#:[id number]   [name] presents today for follow-up of his lower extremity arterial  insufficiency.  He is well-known to me from a prior right femoral to  popliteal bypass for limb salvage when he presented with gangrene of his  right great toe, he had an amputation of his great toe at that time and  this is healed.  He has had no more tissue loss since his surgery.  He  does continue to have multiple lower extremity complaints.  He reports  that he has an unsteady gait and sensation of vibration in his left foot  and also burning feeling of heat in his left foot on occasion.  He had a  prior left knee replacement as well.  He denies any claudication-type  symptoms and has not had any tissue loss.   SOCIAL HISTORY:  Otherwise unchanged.  He is widowed with five children.  He quit smoking in 1953.  Does not drink alcohol on a regular basis.   REVIEW OF SYSTEMS:  Noted in his chart and reviewed with the patient.   PHYSICAL EXAM:  Blood pressure 155/77, pulse 68, respirations 18.  General:  He is in no acute distress.  HEENT is normal.  Abdomen:  Soft,  nontender.  He does have palpable femoral pulses.  He has palpable graft  pulse at the right popliteal space.  I do not feel a left popliteal  graft pulse.  Neurologic is without weakness or paresthesias.  Skin:  Without ulcers or rashes.  He underwent noninvasive vascular laboratory  studies and I reviewed this with the patient and his daughter present.  This shows stable patent graft on the right.  His ankle arm index is  0.58.  He does have some stenosis in his native outflow below the bypass  graft on the right.  This is in all likelihood resulting in the  decreased ankle arm indices on the right.  On the left his ABI is stable  at 0.66.  I do not see any evidence of lower extremity arterial  insufficiency causing any of his symptoms.  He is frustrated in his  slowing ability to walk overall.  He will see [place] for repeat noninvasive  laboratory study follow-up of his bypass in 6 months.     [name], M.D.  Electronically Signed   TFE/MEDQ  D:  11/09/2009  T:  11/12/2009  Job:  [id number]   cc:   [name], MD

## 2010-11-12 NOTE — Procedures (Signed)
BYPASS GRAFT EVALUATION   INDICATION:  Followup right leg bypass graft   HISTORY:  Diabetes:  No  Cardiac:  No  Hypertension:  Yes  Smoking:  No  Previous Surgery:  Right femoral to below-knee popliteal artery bypass  graft with translocated nonreversed saphenous vein on 10/26/2002 by Dr.  Arbie Cookey, right 2nd toe amputation on 12/30/2004.    SINGLE LEVEL ARTERIAL EXAM                               RIGHT              LEFT  Brachial:                    180                170  Anterior tibial:             82                 104  Posterior tibial:            90                 110  Peroneal:  Ankle/brachial index:        0.50               0.61   PREVIOUS ABI:  Date: 05/25/2006  RIGHT:  0.55  LEFT:  0.69   LOWER EXTREMITY BYPASS GRAFT DUPLEX EXAM:   DUPLEX:  Doppler arterial waveforms are biphasic and monophasic proximal  to, throughout, and distal to the graft   IMPRESSION:  1. Patent right femoral to popliteal artery bypass graft.  2. ABIs are stable bilaterally.  3. Elevated velocities noted in the native artery distal to the graft      are stable at 144 cm/second.   ___________________________________________  Larina Earthly, M.D.   DP/MEDQ  D:  04/16/2007  T:  04/18/2007  Job:  [id number]

## 2010-11-12 NOTE — Discharge Summary (Signed)
NAMEJARRY, [name]                 ACCOUNT NO.:  [id number]   MEDICAL RECORD NO.:  [id number]          PATIENT TYPE:  INP   LOCATION:  5009                         FACILITY:  [facility]   PHYSICIAN:  [name], MD  DATE OF BIRTH:  15-Jan-1927   DATE OF ADMISSION:  12/25/2008  DATE OF DISCHARGE:  01/02/2009                               DISCHARGE SUMMARY   ADMITTING DIAGNOSES:  1. Abdominal pain.  2. Alcohol abuse.  3. Peripheral vascular disease.  4. Acute on chronic renal insufficiency.  5. Hypertension.  6. Chronic anemia.  7. Hyponatremia.  8. Hypothyroid disease.  9. Hyperlipidemia.  10.Gastroesophageal reflux disease.  11.Thrombocytopenia.   DISCHARGE DIAGNOSES:  1. Pancreatitis, resolved.  2. Alcohol abuse with detox completed.  3. Peripheral vascular disease.  4. Acute renal insufficiency, resolved.  5. Hypertension, stable.  6. Anemia of chronic disease, stable.  7. Hyponatremia, resolved.  8. Hypothyroid disease, stable.  9. Hyperlipidemia, stable.  10.Gastroesophageal reflux disease, stable.  11.Thrombocytopenia.   PROCEDURES:  1. CT scan of the brain performed June 28 which revealed no      intracranial hemorrhage.  No CT evidence of a large acute infarct.      Small infarct could not be excluded.  2. Two-view chest x-ray June 28 which revealed linear markings at the      lung bases due to scarring in atelectasis and hiatal hernia.  3. Abdominal ultrasound complete which showed no gallstones.  Slightly      limited evaluation of portions of the pancreas, spleen and aorta      secondary to overlying bowel gas.  The portions visualized appeared      unremarkable.  Fatty infiltration of the liver as noted.   HISTORY OF PRESENT ILLNESS:  [name] is an 77 year old African  American gentleman who presented complaining of abdominal pain that  began on the morning of June 26 along with nausea and vomiting of green  emesis.  He reported he was unable to keep  down food or fluids.  The  patient reported that his last alcoholic drink was the night prior to  admission.  He reports he had several friends who have passed away in  the last several weeks and he is significantly depressed and feels that  he himself is going to die soon.  He denied any diarrhea, cough.  He  rates abdominal pain as an 8/10 and it is located predominantly in the  epigastric region.  The patient was found to have elevated pancreatic  enzymes and was admitted for pancreatitis as well alcohol abuse with the  admission alcohol level of 116.   PAST MEDICAL HISTORY:  1. Cellulitis of the right lower extremity with osteomyelitis.  2. History of pancreatitis.  3. History of hypertension.  4. History of chronic anemia.  5. History of alcohol abuse.  6. GERD.  7. Hypothyroid disease.  8. Hyperlipidemia.  9. Degenerative joint disease.  10.Peripheral vascular disease.   PAST SURGICAL HISTORY:  Includes a right total hip replacement, left  total knee replacement, right great toe amputation 2004, right  second  toe amputation 2006 and fem-pop bypass 2004.   SOCIAL HISTORY:  The patient's retired from [employer].  He is a  widower.  He does live with his son who works at night.  The patient  quit tobacco. He does drink and has been drinking more heavily recently.   REVIEW OF SYSTEMS:  This was significant for the absence of fever,  sweats or chills or significant weight change.Marland Kitchen  He did have some left-  sided chest pain without radiation.  The patient reports easily winded.  He does have a cough productive of a white phlegm.  GI: As per the HPI.  The patient has no ENT complaints.  He does report depression but denies  any active suicidal ideation.   ADMISSION PHYSICAL EXAMINATION:  Blood pressure 184/99, heart rate 100,  respirations 23, temperature was 99.6.  HEENT: Exam was unremarkable.  This was an elderly tall male in no acute  distress.  The patient had positive  right thyroid fullness without  distinct nodules.  CARDIOVASCULAR EXAMINATION:  Regular rate and rhythm.  No murmur was  appreciated.  The patient had very poor peripheral pulses with  nonpalpable dorsalis pedis or posterior tibial pulse bilaterally.  The patient had clear breath sounds with no rales, wheezes or rhonchi.  No increased work of breathing.  ABDOMEN:  Significantly tender in the epigastric and left upper quadrant  without guarding or rebound.  RECTAL EXAM:  At admission was deferred.  NEUROLOGIC EXAM:  Unremarkable with no tremors.  PSYCHIATRIC:  Examination revealed the patient alert and oriented.  He  was calm, pleasant, and cooperative.   ADMISSION LABORATORY:  His pH was 7.43, pCO2 of 26, pO2 88.  Total CK  was 312.  Myoglobin was greater than 500.  Troponin I was less than  0.05.  Urine drug screen was negative.  Alcohol level was 116.  UA with  moderate blood and protein.  AST 80, ALT 54.  Hemoglobin 12.4 grams,  representing hemoconcentration; white count 4900.  Chemistries with a  creatinine 2.1, BUN 50;  sodium and potassium were normal.  Glucose was  107.   HOSPITAL COURSE:  1. Abdominal pain.  Patient with pancreatitis.  Amylase on the 30th      was 1635 with a lipase of 758.  This did trend down so by the time      of discharge, lipase was 24, amylase was 162.  The patient was      slowly advanced in diet and the day prior to discharge he was able      to tolerate full liquids without difficulty and was to advance to a      regular diet.  On the day of this discharge dictation, he had no      significant abdominal tenderness.  2. Alcohol withdrawal.  The patient was put on an Ativan  protocol.      He was very somnolent on this regimen.  He did have some agitation      and was treated with Zyprexa 2.5 mg b.i.d. for 24 hours and q.h.s.      This was to be discontinued at time of this dictation with the      patient being back to his mental baseline.  He had  no delirium      tremens during his detox.  3. Peripheral vascular disease.  ABI's were attempted early in      hospitalization but were unsuccessful because of poor  cooperation.      They were ordered and pending at time of discharge dictation.  The      patient is a known vasculopath and would not delay his discharge to      obtain ABI's.  4. Acute renal insufficiency on chronic renal insufficiency.  The      patient had a mildly elevated creatinine at 2.4.  His baseline is      approximately 1.8-1.9.  The patient was hydrated.  His basic      metabolic panel on the day of discharge revealed his creatinine to      his baseline of 1.74.  5. Hypertension.  The patient did have mild hypertension during his      detox.  Lopressor was increased to 25 mg b.i.d..  The patient's      blood pressure stabilized.  On the day of this dictation, the      patient's blood pressure was 129/76 on his present medical regimen      which he will continue.  That includes amlodipine 10 mg daily,      metoprolol 50 mg b.i.d., Lasix 40 mg daily, clonidine 0.1 mg q.4      hours p.r.n., although no p.r.n.'s have been required in the last      24 hours.  6. Anemia.  The patient has a chronic anemia.  Going back in his      history, his baseline to December 2009 was 9.9 grams.  He currently      is at his baseline or close to it with a hemoglobin 9.3 grams.  The      patient does have an elevated MCV and may be B12 deficient and      should be started on B12 replacement.  7. Hypothyroid disease.  Last TSH on December 26, 2008, was normal range      at 0.309.  8. Rhabdomyolysis.  The patient have a mildly elevated CPK which was      thought to be due to immobilization during some of his drinking.      He was followed with serial CKs and was hydrated vigorously.  Final      CK total on the day of discharge was 168 in normal range.  9. Hyperlipidemia.  Lipid panel from December 26, 2008, revealed      cholesterol of 178,  triglycerides 48, HDL 116, LDL was 52 this      problem is stable.  He has been on no medications during his      hospital stay for this problem.  Reviewing his medication      reconciliation sheet, he was not medication prior to admission.  10.Gastroesophageal reflux disease.  The patient has been stable, he      has been on proton pump inhibitor.  11.Low grade fever.  Patient with abdominal pain and low grade fever.      No specific source of infection was noted.  He was started on      Augmentin for any possible GI related infection.  This does not      need be continued at time of discharge, given he is afebrile again      with normal white count and no sign of infection.   DISCHARGE EXAMINATION:  Temperature of 98.9, blood pressure 129/76,  heart rate 74, respirations 20, O2 sats 98% on room air.  GENERAL APPEARANCE:  This is a pleasant African American gentleman lying  in bed  in no acute distress.  HEENT: Exam was grossly unremarkable.  Conjunctivae and sclerae was  clear.  NECK:  Supple.  CHEST: Patient is moving air well.  No rales, wheezes, rhonchi, no  increased work of breathing.  CARDIOVASCULAR: The patient has a quiet precordium.  His heart rate was  regular without murmurs.  ABDOMEN: The patient had positive bowel sounds all 4 quadrants.  He had  no guarding or rebound.  There is no tenderness to deep palpation in the  epigastrium.  GENITALIA:  The patient has a condom catheter in place.  EXTREMITIES: Upper extremities were normal.  Right knee has a small  effusion but good range of motion.  No tenderness.  No erythema, no  heat.  Left knee with a total knee replacement appears normal.  The  patient has a toe amputations as noted in the history.  His feet are  slightly cool to touch.  Pulses were nonpalpable.  NEUROLOGIC EXAM.  The patient is awake, alert.  He is oriented to  person, place, time and context.  He is reluctantly agreeing to go to a  skilled care facility  for 7-10 days of rehab but wishes to return home.  He does appear to be competent at  this point.   FINAL LABORATORY:  Lipase 24, total CK 168, sodium 139, potassium 3.6,  chloride 106, CO2 of 25, BUN of 18, creatinine 1.74, glucose of 96,  amylase 162, white count 3600, hemoglobin 9.3 grams, MCV 102.6.  Lipid  panel as noted above.  Thyroid function as noted above.  Ammonia level  from December 25, 2008, was 18.   DISCHARGE MEDICATIONS:  The patient will continue his home medications  as outlined on the medication reconciliation sheet including:  1. Aspirin 81 mg daily.  2. Amlodipine 10 mg daily.  3. Iron 325 mg daily.  4. Meloxicam 15 mg daily.  5. Avalide/HCT 300/25 once daily.  6. Plavix 75 mg daily.  7. Multivitamin daily.  8. Zolpidem 10 mg q.h.s. p.r.n.  9. Creon 1200 units, 2 tablets before meals.  10.Advil PM as needed.  11.MiraLax 17 grams at bedtime.  12.Famotidine is listed on the patient medication sheet.  This will be      continued.  13.Vitamin B12 orally once daily.  14.Milk of magnesia as needed for constipation uncontrolled by      MiraLax.   The patient's condition at time of discharge dictation is stable and  improved.  He is ready for transfer to skilled facility when bed is  available but he should continue with daily physical therapy,  occupational therapy if needed.  Anticipate skilled care stay of 7-10  days.      Rosalyn Gess Norins, MD  Electronically Signed     MEN/MEDQ  D:  01/02/2009  T:  01/02/2009  Job:  [id number]

## 2010-11-12 NOTE — Assessment & Plan Note (Signed)
OFFICE VISIT   [name], [name]  DOB:  07-Jan-1927                                       04/16/2007  HQION#:[id number]   Patient presents today for evaluation of right leg swelling.  He is well  known to me from prior right femoral-to-popliteal bypass in April, 2004  for amputation of nonhealing toe ulcerations.  He has been followed in  our office for vascular lab protocol since that time.  He presents  complaining of right leg swelling.  He states that this has been present  over the past several weeks.  He does have multiple arthritic complaints  as well with tight hip with prior replacement and also knee replacement.  He does not have any tissue loss in his lower extremities.  He was seen  at the [facility] office and underwent venous duplex that showed no  evidence of DVT one week ago.  He is here today for further evaluation.   PHYSICAL EXAMINATION:  He does have chronic swelling in his right leg  with no new change.  He does have a prior right vein harvest.  He has no  tissue loss in his right foot.  He does have palpable popliteal graft  pulse.  Blood pressure today is 140/88.  Pulse is 76.  O2 sats are 99%  on room air.   He underwent noninvasive arterial study in our office today, and this  reveals no evidence of ischemia with stable ankle brachial index on the  right at 0.5 and on the left at 0.61.  His graft is patent with  monophasic flow.   I discussed this with the patient.  He does not have any evidence of DVT  by venous duplex or arterial insufficiency or change from an arterial  standpoint.  He does have chronic swelling and has since his bypass in  2004, and I feel that this is probably just a continuation of this.  He  does  not appear to have any symptoms of congestive heart failure.  He will  continue his usual activities, and we will see him in vascular lab  protocol.   Larina Earthly, M.D.  Electronically Signed   TFE/MEDQ  D:   04/16/2007  T:  04/19/2007  Job:  [id number]   cc:   Barbette Hair. Artist Pais, DO

## 2010-11-12 NOTE — Procedures (Signed)
BYPASS GRAFT EVALUATION   INDICATION:  Followup, right fem-pop bypass graft on 10/26/02.   HISTORY:  Diabetes:  No.  Cardiac:  No.  Hypertension:  Yes.  Smoking:  No.  Previous Surgery:  Please see above.   SINGLE LEVEL ARTERIAL EXAM                               RIGHT              LEFT  Brachial:                    161                161  Anterior tibial:             63                 75  Posterior tibial:            75                 72  Peroneal:  Ankle/brachial index:        0.47               0.47   PREVIOUS ABI:  Date: 04/16/07  RIGHT:  0.50  LEFT:  0.61   LOWER EXTREMITY BYPASS GRAFT DUPLEX EXAM:   DUPLEX:  Patent right fem-pop bypass graft with no evidence of focal  stenosis.   IMPRESSION:  1. Patent right femoropopliteal bypass graft with no evidence of focal      stenosis.  2. Moderately abnormal ankle brachial index with monophasic Doppler      waveforms noted in bilateral legs.  3. Bilateral ankle brachial indices appear to be decreased, compared      to previous.  4. Status post right femoropopliteal bypass graft.   ___________________________________________  Larina Earthly, M.D.   MG/MEDQ  D:  01/14/2008  T:  01/14/2008  Job:  [id number]

## 2010-11-12 NOTE — Op Note (Signed)
NAMEMARVELLE, CAUDILL NO.:  [id number]   MEDICAL RECORD NO.:  [id number]          PATIENT TYPE:  INP   LOCATION:                               FACILITY:  [facility]   PHYSICIAN:  Almedia Balls. Ranell Patrick, M.D. DATE OF BIRTH:  Apr 30, 1927   DATE OF PROCEDURE:  06/03/2008  DATE OF DISCHARGE:                               OPERATIVE REPORT   PREOPERATIVE DIAGNOSIS:  Osteomyelitis, right foot.   POSTOPERATIVE DIAGNOSIS:  Osteomyelitis, right foot.   PROCEDURE PERFORMED:  Incision and drainage of right foot with removal  of a portion of first metatarsal base.   ATTENDING SURGEON:  Almedia Balls. Ranell Patrick, MD   ASSISTANT:  Donnie Coffin. Dixon, PA-C   ANESTHESIA:  General anesthesia was used.   ESTIMATED BLOOD LOSS:  Minimal.   FLUID REPLACEMENT:  500 mL crystalloid.   INSTRUMENT COUNTS:  Correct.   COMPLICATIONS:  None.   Perioperative antibiotics were given.   INDICATIONS:  The patient is a 77 year old male who is a patient of Dr.  Illene Regulus who is currently admitted for increasing redness and  swelling of the right foot.  The patient's past history was significant  for prior vascular bypass procedure by Dr. Gretta Began.  The patient had  subsequently gone on to dry gangrene of his great toe and his second toe  requiring amputation.  The patient did have a total of 4 surgeries on  the right foot.  I have discussed this case with Dr. Myra Gianotti today and  given the patient has a hip replacement and knee replacement and there  is MRI documented infection of his foot, it was decided the Orthopedics  would take the patient to surgery for an I&D of his foot.  There was  concern on the MRI of potential osteomyelitis of the remaining portion  of the first metatarsal with no bony destruction.   DESCRIPTION OF PROCEDURE:  After an adequate level of anesthesia was  achieved, the patient was positioned supine on the operating table.  Nonsterile tourniquet was placed on right proximal  thigh.  The right leg  was sterilely prepped and draped in the usual manner.  A medial incision  was created over the first ray.  Dissection was carried sharply down  through the subcutaneous tissues.  Superficial nerves were identified  and protected.  The soft tissue overlying the metatarsal base that was  remaining after the prior ray reamputation was divided.  Just plantar to  the patient's metatarsal, there was noted to be some cloudy fluid and  there was not very much of this, but we did go and sent that for culture  both aerobic, anaerobic, and stat Gram stain.  The bone did feel soft on  that plantar base consistent with osteomyelitis and at this point, we  removed proximally semi or half of the first metatarsal base  including all the soft bone.  The remaining bone felt hard and was left  in place.  We thoroughly pulse irrigated using 3 L normal saline  irrigation.  We then packed the wound and closed the margins,  and the  patient was awaken and taken from the operating theater with a bulky  splint in place.      Almedia Balls. Ranell Patrick, M.D.  Electronically Signed     SRN/MEDQ  D:  06/04/2008  T:  06/05/2008  Job:  [id number]
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
## 2010-11-12 NOTE — Procedures (Signed)
BYPASS GRAFT EVALUATION   INDICATION:  Lower extremity bypass graft evaluation.   HISTORY:  Diabetes:  Yes.  Cardiac:  No.  Hypertension:  Yes.  Smoking:  No.  Previous Surgery:  Right fem-pop bypass graft on 10/18/2002 by Dr.  Arbie Cookey.   SINGLE LEVEL ARTERIAL EXAM                               RIGHT              LEFT  Brachial:                    158                157  Anterior tibial:             84                 84  Posterior tibial:            65                 71  Peroneal:  Ankle/brachial index:        0.53               0.53   PREVIOUS ABI:  Date: 01/14/2008  RIGHT:  0.47  LEFT:  0.47   LOWER EXTREMITY BYPASS GRAFT DUPLEX EXAM:   DUPLEX:  1. Patent right fem-pop bypass graft with no evidence of recurrent      stenosis.  2. Increased velocity of 337 cm/s noted in the right native outflow      artery.   IMPRESSION:  1. Right ankle brachial index suggests moderate arterial disease with      biphasic Doppler waveforms.  2. Left ankle brachial index suggests moderate arterial disease with      monophasic Doppler waveforms.       ___________________________________________  Larina Earthly, M.D.   AC/MEDQ  D:  07/27/2008  T:  07/27/2008  Job:  [id number]

## 2010-11-14 ENCOUNTER — Emergency Department (HOSPITAL_COMMUNITY): Payer: Medicare Other

## 2010-11-14 ENCOUNTER — Emergency Department (HOSPITAL_COMMUNITY)
Admission: EM | Admit: 2010-11-14 | Discharge: 2010-11-14 | Disposition: A | Discharge status: Home / Self Care | Payer: Medicare Other | Attending: Emergency Medicine | Admitting: Emergency Medicine

## 2010-11-14 DIAGNOSIS — Z79899 Other long term (current) drug therapy: Secondary | ICD-10-CM | POA: Insufficient documentation

## 2010-11-14 DIAGNOSIS — I10 Essential (primary) hypertension: Secondary | ICD-10-CM | POA: Insufficient documentation

## 2010-11-14 DIAGNOSIS — L97509 Non-pressure chronic ulcer of other part of unspecified foot with unspecified severity: Secondary | ICD-10-CM | POA: Insufficient documentation

## 2010-11-14 DIAGNOSIS — M79609 Pain in unspecified limb: Secondary | ICD-10-CM | POA: Insufficient documentation

## 2010-11-15 NOTE — Discharge Summary (Signed)
[facility]  Patient:    [name], [name] Visit Number: [id number] MRN: [id number]          Service Type: SUR Location: [id number] Attending Physician:  Loanne Drilling Dictated by:   Mahar Jill Side, P.A. Admit Date:  10/11/2001 Discharge Date: 10/16/2001                             Discharge Summary  DATE OF BIRTH:  March 17, 1927  ADMITTING DIAGNOSES:  1. Severe right hip osteoarthritis.  2. Hypertension.  3. Hypothyroidism.  4. History of anemia.  DISCHARGE DIAGNOSES:  1. Status post right total hip arthroplasty doing very well.  2. Postoperative hemorrhagic anemia that was stable and did not require     transfusion.  3. Postoperative fever that was resolved prior to discharge.  4. Hypertension.  5. Hypothyroid.  PROCEDURE:  Right total hip arthroplasty done by Dr.  Ollen Gross with the assistance of Irena Cords, P.A.-C. Anesthesia use was general.  CONSULTATIONS:  Dr. Thomasena Edis from [facility].  BRIEF HISTORY:  The patient is a 77 year old black male who has been having long history of right hip pain and failed conservative management. It got to the point that it was near constant in nature. It was causing radiating pain down his leg, causing difficulty ambulating, interfering with his activities of daily living and quality of life. The risks and benefits of the proposed surgery were discussed with the patient by Dr. Ollen Gross. He indicated an understanding and opted to proceed.  LABORATORY DATA:  On October 06, 2001, CBC with DIF showed wbc of 3.6 which was low, rbc low at 4.09, rdw percent elevated at 17.5 otherwise within normal limits. PT, INR and PTT 13.3, 1.0 and 31 respectively. Complete metabolic panel was within normal limits. Hemoglobin and hematocrit were monitored postoperatively. Dropped down to a low of 9.7 and 27.8 respectively on October 14, 2001; however, he was asymptomatic and did not require any  blood transfusion. PT and INR were monitored postoperatively while on Coumadin gradually increasing up reaching near therapeutic range on October 16, 2001 at 18.7 and 1.8 respectively. He was on Coumadin at the time. Basic metabolic panel was within normal limits on October 12, 2001 with the exception of glucose slightly elevated at 135. UA from October 06, 2001 was negative. Blood typing from October 06, 2001 was blood type O, Rh positive, antibody screen negative. EKG from May 31, 2002 showed sinus bradycardia with second degree SA block ______, left ventricular hypertrophy, ST elevation confirmed by Chanda Busing. X-rays from October 06, 2001 of the pelvis and right hip showed severe degenerative hypertrophic arthritic changes to the right hip. April 14 right hip and pelvis show a satisfactory follow-up exam of the pelvis and right hip with total hip replacement on the right thought to be in satisfactory position and alignment. There is no recent EKG on the chart.  HOSPITAL COURSE:  The patient was admitted on October 11, 2001. Following this, he was taken to the operating room for the above listed procedure. He tolerated the surgery very well without any intraoperative complications. Estimated blood loss was 650 cc, there were no drains placed, there were no intraoperative complications. He was transferred to the recovery room in stable condition.  Postoperatively, the patients home medications were resumed. He completed an appropriate course of antibiotics, Ancef IV x24 hours. His pain control was initiated again utilizing PCA and  morphine as well as p.o. analgesics and remained adequate. He was transitioned over to p.o. analgesics through postoperative day two and pain control remained adequate. He was started on Coumadin postoperatively. This was dosed and managed per pharmacy through his inpatient stay.  The patients diet was advanced to a regular diet without any difficulty.  The  incentive spirometer was utilized q. one hour while awake in an attempt to decrease any pulmonary complications. ______ and TED hose were placed to assist with DVT prophylaxis.  Physical therapy and occupational therapy were consulted to assist with total hip protocol and precautions, education to the patient as well as touchdown weightbearing limitations. Initially he was slow to progress with physical therapy; however, he did make significant improvements between postoperative day two to day three. A rehab consult was ordered and they thought he would likely be an appropriate candidate; however, while waiting for a bed to become available, by postoperative day three the patient was ambulating approximately 90 feet with supervision. He was still requiring close supervision, however, through postoperative day four to maintain his weightbearing limitations. On October 14, 2001, the patients weightbearing status was changed to 50% partial weight bearing. By postoperative day four, it was felt that he was doing very well with therapy; however, he was still requiring verbal qs and close supervision to maintain his hip precautions as well as weightbearing status. However, he was doing so well it was felt that he was no longer going to be a candidate for [facility].  Discharge planners were consulted to assist [place] with arranging home health needs through [facility] for this patient and getting things ready as far as physical therapy as well as nursing at home.  On postoperative day two, the patients operative dressing was taken down to reveal a good looking incision. There were no signs or symptoms of infection. Daily dressing changes were done thereafter and the incision continued to look good. On postoperative day two, the patient was running a temperature of 101.8. This was felt to be secondary to atelectasis; therefore, we encouraged him and increased his incentive spirometer use over  the next few days. His temperature did resolve prior to discharge. By October 16, 2001, the patient was  doing very well, he had met all orthopedic goals. He was independent and safe with his total hip precautions as well as protocol per physical therapy. He was medically stable. He was afebrile, vital signs were stable and ready for discharge to his home. The incision looked good without any signs of infection. Motor and sensory were intact. He was neurovascularly intact.  DISCHARGE PLAN:  MEDICATIONS:  1. Coumadin dosed per pharmacy at 5 mg per day at 6 p.m. This will be     managed and dosed per [facility] as an outpatient.  2. Robaxin 500 mg.  3. Percocet 5/325.  DISCHARGE ACTIVITY:  50% partial weight bearing to the operative extremity.  WOUND CARE:  Dressing changes done on a daily basis. The patient may shower when there is no drainage from the incision. Total hip protocol and precautions which will be worked with the patient through [facility] home health physical therapy and occupational therapy.  FOLLOW-UP:  10 days from discharge with Dr. Ollen Gross. He was instructed to call for an appointment at the office.  DISCHARGE DIET:  Resume his preoperative diet as tolerated.  DISPOSITION:  Patient is being discharged to his home with his family as well as [facility] physical therapy, occupational therapy,  and nursing.  CONDITION ON DISCHARGE:  Stable and improved. Dictated by:   Mahar Jill Side, P.A. Attending Physician:  Loanne Drilling DD:  10/25/01 TD:  10/26/01 Job: [id number]

## 2010-11-15 NOTE — Discharge Summary (Signed)
[facility]  Patient:    [name], [name] Visit Number: [id number] MRN: [id number]          Service Type: MED Location: [phone] Attending Physician:  Pola Corn Dictated by:   Lyla Son. Achilles Dunk. Admit Date:  06/20/2001 Discharge Date: 06/25/2001                             Discharge Summary  DISCHARGE DIAGNOSES: 1. Thrombocytopenia. 2. Acute pancreatitis. 3. Alcohol cardiomyopathy. 4. Esophagitis. 5. Hypothyroidism. 6. Cardiac dysrhythmia.  HISTORY OF PRESENT ILLNESS:  The patient is a 77 year old well-developed, well-nourished patient who was initially seen in the emergency room of [facility] on 06/20/01.  He came to the emergency room by [facility] after giving a three week history of nausea.  He states that he drank approximately one-fifth of alcohol  the day prior to admission and stated he was having a gagging sensation on the morning of admission.  On examination in the emergency room the patient was noted to have blood pressure of 203/115 initially.  The complete blood count in the emergency room showed a hemoglobin of 10, hematocrit 30, MCV 86, platelet count low at 65K.  The CK was 313 with MB of 6.9.  Troponin I was 0.03 which was normal.  The patient was subsequently admitted for evaluation and treatment of his particular problem. The patient was also found to have some problems with gastritis.  HOSPITAL COURSE:  The patient was admitted to the medical service for rule out myocardial infarction.  His electrocardiograms had normal sinus rhythm with nonspecific ST-T wave changes and left ventricular hypertrophy but was nonacute.  The patient underwent a Persantine Cardiolite study which revealed an ejection fraction of 46%, decreased activity inferiorly but no evidence of ischemia was appreciated at this point.  The patient also underwent lower extremity Doppler evaluation which revealed moderate  reduced arterial flow bilaterally.  There was 50% stenosis from the end distal superficial femoral artery, the popliteal arteries were noted to be patent on the right.  On the left there was occlusive disease in the superficial femoral artery from the origin to the distal area. At this point a consultation was obtained with Dr. Gretta Began.  After evaluation it was found that indeed there was some arterial problems present but at this point the patient is asymptomatic and therefore it was recommended no further work-up be done at this time but that at any time the patient became symptomatic vascular surgery would be happy to re-evaluate the patient.  Following this a gastrointestinal consultation was obtained with Dr. Melvia Heaps.  After his review it was found the patient uses alcohol frequently and that also he used Aleve. It was recommended that the Protonix be continued and that the patients lipase be checked and if possible an endoscopy be planned.  The lipase was found to be normal.  On 06/24/01 the patient had endoscopy done.  There was noted esophageal inflammation as a result of infectious esophagitis.  Brushings were obtained also to rule out Candida. There was noted AVMs that were not bleeding in the body of the ampulla.  The brushings showed reactive squamous cells and abundant fungal organisms consistent with Candida but no evidence of malignancy.  The CT scan of the abdomen and pelvis was also obtained and was found to be unremarkable.  The patient was ruled out for myocardial infarction.  He  was found to have some vascular disease of the lower extremities, however, it was the opinion that the patient could be discharged home and plans were made for the patient to receive home health as well as being evaluated for substance abuse counseling.  MEDICATIONS AT DISCHARGE: 1. Multiple vitamins with iron one q.d. 2. Potassium 20 mEq daily. 3. Avapro 300 mg q.a.m. 4.  Synthroid 88 mcg daily. 5. Norvasc 5 mg daily. 6. Diflucan 100 mg daily. 7. Darvocet-N 100, one q.4-6h as needed. 8. Protonix 40 mg daily. 9. Hydrochlorothiazide 12.5 mg q.d.  DIET:  Patient is on a 2 gram sodium-restricted diet.  FOLLOW UP:  Patient is to be seen in the office in one to two weeks.  As noted above plans were made for substance abuse counseling.  Plans will also be made for orthopedic surgery evaluation.  Patient is to notify his physician immediately of any changes, problems or concerns. Dictated by:   Lyla Son. Achilles Dunk. Attending Physician:  Pola Corn DD:  07/14/01 TD:  07/15/01 Job: [id number]

## 2010-11-15 NOTE — Op Note (Signed)
NAMEZOE, NORDIN                      ACCOUNT NO.:  [id number]   MEDICAL RECORD NO.:  [id number]                   PATIENT TYPE:  OIB   LOCATION:  2867                                 FACILITY:  [facility]   PHYSICIAN:  Larina Earthly, M.D.                 DATE OF BIRTH:  12-08-1926   DATE OF PROCEDURE:  DATE OF DISCHARGE:  05/24/2002                                 OPERATIVE REPORT   PREOPERATIVE DIAGNOSIS:  Bilateral lower extremity arterial insufficiency.   POSTOPERATIVE DIAGNOSIS:  Bilateral lower extremity arterial insufficiency.   PROCEDURE:  Aortogram with bilateral lower extremity runoff.   SURGEON:  Larina Earthly, M.D.   ASSISTANT:  Nurse.   ANESTHESIA:  1% lidocaine local.   COMPLICATIONS:  None.   DISPOSITION:  To holding area stable.   DESCRIPTION OF PROCEDURE:  The patient was taken to the peripheral vascular  catheterization lab, placed in the supine position, where the area of the  right and left groin was prepped and draped in the usual sterile fashion.  Using local anesthesia and a single wall puncture, the right common femoral  artery was entered and a guidewire was passed up to the level of the  suprarenal aorta.  A 5 French sheath and pigtail catheter were positioned  over the guidewire and the guidewire was removed.  The aortogram was  obtained with the pigtail catheter being positioned just above the level of  the renal artery takeoff.  The patient had widely patent single renal  arteries bilaterally.  There was no evidence of aortoiliac occlusive  disease.  The catheter was then withdrawn down to the level of the aortic  bifurcation, and runoff was obtained.  There was moderate external iliac  stenosis on the left and no significant iliac stenosis on the right.  On the  right leg runoff, there was complete occlusion of the superficial femoral  artery in the proximal thigh with reconstitution of the popliteal artery  above the knee.  There was  diffuse disease of the below-knee popliteal  artery and complete occlusion of the anterior tibial and posterior tibial  artery takeoffs.  There was a small peroneal artery that occluded in  midcalf.  There were no tibial vessels in the distal calf.  On lateral foot  projections on the right, there was late reconstitution of a small dorsalis  pedis artery that appeared to recanalize at the level of the ankle joint  into the foot.  No posterior tibial artery was patent, and there were  multiple small collaterals into the foot.  Left leg runoff was noted for  again complete occlusion of the superficial femoral artery at its takeoff  and with reconstitution of the popliteal artery above the knee.  On the left  there was two-vessel runoff via the posterior tibial and peroneal arteries.  The posterior tibial artery was patent down to the level of the ankle and  appeared to become atretic at the level of the ankle.  The patient tolerated  his procedure without immediate complication, was transferred to the holding  area, where the sheath was pulled.   FINDINGS:  1. No evidence of aortoiliac occlusive disease.  2. Bilateral superficial femoral artery occlusion.  3.     Right severe tibial disease with complete occlusion of all tibial vessels     with reconstitution of distal dorsalis pedis artery at the ankle.  4. Runoff on the left via the posterior tibial and peroneal arteries.                                               Larina Earthly, M.D.    TFE/MEDQ  D:  05/25/2002  T:  05/25/2002  Job:  [id number]
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
## 2010-11-15 NOTE — Op Note (Signed)
[facility]  Patient:    [name], [name] Visit Number: [id number] MRN: [id number]          Service Type: SUR Location: 4W 0447 01 Attending Physician:  Loanne Drilling Dictated by:   Ollen Gross, M.D. Proc. Date: 10/11/01 Admit Date:  10/11/2001                             Operative Report  PREOPERATIVE DIAGNOSIS:  Osteoarthritis, right hip.  POSTOPERATIVE DIAGNOSIS:  Osteoarthritis, right hip.  PROCEDURE:  Right total hip arthroplasty.  SURGEON:  Ollen Gross, M.D.  ASSISTANT:  Marcie Bal. Troncale, P.A.C.  ANESTHESIA:  General.  ESTIMATED BLOOD LOSS:  650  DRAINS:  Hemovac x1.  COMPLICATIONS:  None.  CONDITION:  Stable to recovery.  BRIEF CLINICAL NOTE:  [name] is an 77 year old male with severe end-stage osteoarthritis of the right hip with pain refractory to nonoperative management. He presents now for right total hip arthroplasty.  DESCRIPTION OF PROCEDURE:  After successful administration of general anesthetic, the patient was placed in the right lateral decubitus position with the left side up and held with the hip positioner. The left lower extremity was isolated from his perineum with plastic drapes and prepped and draped in the usual sterile fashion. A standard posterolateral incision was made with a 10 blade through the subcutaneous tissue to the level of the fascia lata which is incised in line with the skin incision. The sciatic nerve was palpated and protected and short external rotators were isolated off the femur. Capsulectomy is then performed. The hip is dislocated, center of the femoral head marked and trial prosthesis placed such that the center of the trial head corresponds to the center of his native femoral head. The osteotomy line is marked on the femoral neck and osteotomy made with an oscillating saw. The femoral head is then removed.  The femur is retracted anteriorly, anterior labrum ______ were removed  and then acetabular exposure obtained. He had a tremendous amount of osteophytes which are removed. Acetabular reaming started at a 49 coursing in increments of 2 to a 55 and then a 56 mm pinnacle shell was utilized. He had a lot of cysts present superiorly and ______ centrally. This cyst material was curetted out and bone from the femoral head is morcellized with the reamer and then impacted into this cyst cavity. The 56 mm acetabular shell was then impacted into the acetabulum with excellent fit and approximately 40 degrees of abduction and 20 degrees of forward flexion. It was then transfixed with two dome screws. A trial 32 plus 4 neutral liner is placed.  The femur is prepared first with the canal finder and irrigation. Axial reaming is performed to a 15.5 mm proximal reaming to a 26F and the sleeve machine to a small. A 26F small sleeve is placed with a 20 x 15 stem and a 36 plus 8 neck. I matched his native anteversion and we used a 32.0 head. The hip is reduced with great stability, full extension, full external rotation to 70 degrees flexion, 40 degrees adduction, 90 degrees internal rotation and 90 degrees flexion, and 70 degrees internal rotation. Trials are removed and copiously irrigated and the apex hole eliminator and permanent 32 mm neutral plus 4 marathon acetabular liner is impacted into the shell. The 26F small sleeve is placed in the proximal femur, 20 x 15 stem with a 36 plus 8 neck placed into the canal  matching his native anteversion. A 32.0 head is placed and then the hip reduced with the same stability parameters. The wound is copiously irrigated with antibiotic solution and short external rotators reattached to the femur through drill holes. The fascia lata was closed over a Hemovac drain, interrupted #1 Vicryl, subcu closed with interrupted 2-0 Vicryl, subcuticular running 4-0 monocryl. The incision was cleaned and dried and Steri-Strips and a bulky sterile  dressing applied. The patient was awakened and transported to recovery in stable condition. Dictated by:   Ollen Gross, M.D. Attending Physician:  Loanne Drilling DD:  10/11/01 TD:  10/11/01 Job: [id number]
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
## 2010-11-15 NOTE — Op Note (Signed)
[facility]  Patient:    [name], [name] Visit Number: [id number] MRN: [id number]          Service Type: SUR Location: 4W 0468 01 Attending Physician:  [name] Dictated by:   [name], M.D. Proc. Date: 12/06/01 Admit Date:  12/06/2001                             Operative Report  PREOPERATIVE DIAGNOSIS:  Osteoarthritis of left knee.  POSTOPERATIVE DIAGNOSIS:  Osteoarthritis of left knee.  PROCEDURE:  Left total knee arthroplasty.  SURGEON:  [name], M.D.  ASSISTANT:  [name], P.A.-C.  ANESTHESIA:  General.  ESTIMATED BLOOD LOSS:  Minimal.  DRAINS:  Hemovac x1.  COMPLICATIONS:  None.  TOURNIQUET TIME:  Fifty minutes at 300 mmHg.  CONDITION:  Stable to recovery.  BRIEF CLINICAL NOTE:  The patient is a 77 year old male with severe osteoarthritis of his left knee with pain refractory to nonoperative management.  He has had a recent successful right total hip which has eliminated the majority of his right-sided pain, but still has the severe left knee pain.  He presents now for left total knee arthroplasty.  DESCRIPTION OF PROCEDURE:  After successful administration of general anesthetic, the tourniquet was placed high on the left thigh, and the left lower extremity prepped and draped in the usual sterile fashion.  Extremity was wrapped in an Esmarch and knee flexed.  The tourniquet inflated to 300 mmHg.  A standard midline incision was made with the 10 blade through subcutaneous tissue to the level of the extensor mechanism.  A fresh blade was used to make a medial parapatellar arthrotomy.  Soft tissue over the proximal medial tibia was elevated to the joint line with the knife and the semimembranous bursa with a curved osteotome.  Soft tissue over the proximal lateral tibia was also elevated with attention being paid to avoid the patellar tendon on the pubic tubercle.  The lateral patellofemoral  ligaments cut, the patella everted, and the knee flexed 90 degrees.  He had severe degenerative change throughout both compartments and the patellofemoral compartment.  We subsequently removed the ACL and PCL.  The drill was then used to create a starting hole in the distal femur and the canal was irrigated.  A 5 degree left valgus alignment guide was placed.  Referencing off the posterior condyles, rotation was marked and blocked, and removed 10 mm off the distal femur.  Distal femoral resection was made with an oscillating saw.  A sizing block was placed and the size 5 is the most appropriate.  The rotation is corresponding with the epicondylar axis.  If I rotated off the posterior condyles, he would have internally rotated, thus we used the epicondylar axis. The size 5 cutting block is placed and anterior and posterior cuts made.  The tibia was then subluxed forward and the menisci removed.  Extramedullary tibial alignment guide was placed, surfacing proximally at the medial third of the tibial tubercle and distally along the second metatarsal axis and tibial crest.  This block is pinned to take 2 mm off the large lateral defect which corresponds to 10 mm of the uninvolved medial side.  Tibial resection was made with an oscillating saw.  The size 5 was the most appropriate tibia and then the modular drill and keel punch was used to prepare the proximal tibia. Intercondylar block was placed on the distal femur and chamfer  cuts and an intercondylar cut made.  A size 5 posterior stabilized femur with the size 5 mobile bearing tibial tray and the 12.5 mm posterior stabilizer rotating platform insert was placed.  Full extension is achieved with excellent varus and valgus balance through the full range of motion.  The patella was everted and thickness measured to be 23 mm.  Free hand resection was 13 mm.  A 38 template was placed and lug holes were drilled.  Trial patella placed and the  patella tracks normally.  The osteophytes are then removed off the posterior femur with the trial in place.  All trials are removed.  Cut bone surfaces are prepared with pulsatile lavage.  Cement is mixed and once ready for implantation, the size 5 posterior stabilized femur, size 5 mobile bearing tibial tray, and 38 patella all cemented into place.  The trial 12.5 insert is placed and the knee held in full extension and all extruded cement removed.  Once the cement had fully hardened, then the permanent 12.5 mm, size 5 rotating platform mobile bearing tibial tray is placed into t he tibial component.  The knee is reduced and once again there is outstanding stability throughout.  The wound was copiously irrigated with antibiotic solution and the extensor mechanism closed over a Hemovac drain with interrupted #1 PDS. The tourniquet was released with a total time of 50 minutes.  Minor bleeding was stopped with cautery.  The subcutaneous was closed with interrupted 2-0 Vicryl and subcuticular running 4-0 Monocryl.  The incision was clean and dry, and Steri-Strips and a bulky sterile dressing applied.  Drains were hooked to suction.  He was placed in a knee immobilizer and transported to the recovery room in stable condition. Dictated by:   [name], M.D. Attending Physician:  [name] DD:  12/06/01 TD:  12/08/01 Job: [id number]

## 2010-11-15 NOTE — H&P (Signed)
[facility]  Patient:    [name], [name] Visit Number: [id number] MRN: [id number]          Service Type: Attending:  Ollen Gross, M.D. Dictated by:   Sammuel Cooper. Mahar, P.A. Adm. Date:  10/11/01                           History and Physical  DATE OF BIRTH:  1926-08-08  CHIEF COMPLAINT:  Right hip pain.  HISTORY OF PRESENT ILLNESS:  The patient is a 77 year old black male who has been having a long history of right hip pain that has failed conservative management.  It has gotten to the point that it is near constant in nature. It is causing radiating pain down his leg.  It is causing difficulty ambulating.  The risks and benefits of the proposed surgery were discussed with the patient by Dr. Ollen Gross, and he indicated understanding and wished to proceed.  ALLERGIES:  No known drug allergies.  MEDICATIONS: 1. Synthroid 88 mcg p.o. q.d. 2. Norvasc 5 mg p.o. q.a.m. 3. Hydrochlorothiazide 12.5 mg p.o. q.a.m. 4. Avapro 300 mg p.o. q.a.m.  PAST MEDICAL HISTORY: 1. Hypertension. 2. Hypothyroidism. 3. History of anemia, with unknown diagnosis.  PAST SURGICAL HISTORY:  None.  SOCIAL HISTORY:  Denies tobacco use.  Denies any current alcohol use.  He is a recovered alcoholic.  He is married.  He would prefer to have home health physical therapy if at all possible after his initial hospital stay.  FAMILY HISTORY:  Diabetes.  Cancer.  Hypertension.  Questionable stroke.  REVIEW OF SYSTEMS:  The patient denies any fevers, chills, night sweats, or bleeding tendencies.  CNS:  Denies any blurred vision, double vision, headaches, seizure, or paralysis.  CARDIOVASCULAR:  Denies any chest pain, angina, orthopnea, claudication, or palpitations.  PULMONARY:  Denies any shortness of breath, productive cough, or hemoptysis.  GASTROINTESTINAL: Denies any nausea, vomiting, constipation, diarrhea, melena, or bloody stool. GENITOURINARY:  Denies any dysuria,  hematuria, or discharge.  MUSCULOSKELETAL: As per HPI.  PHYSICAL EXAMINATION:  VITAL SIGNS:  Respirations 16 and unlabored, pulse 72 and regular.  GENERAL:  The patient is a 77 year old black male who is alert and oriented, in no acute distress.  Well-nourished, well-groomed.  Appears his stated age. Does walk very carefully about the room.  He does walk with an antalgic gait. He is very pleasant and cooperative to examination.  HEENT:  Head normocephalic, atraumatic.  Pupils equal, round, and reactive to light.  Extraocular movements intact.  Nares patent bilaterally.  Pharynx is clear, without any erythema or exudate.  NECK:  Soft to palpation.  No bruits appreciated.  No lymphadenopathy noted. No thyromegaly noted.  CHEST:  Clear to auscultation bilaterally.  No rales, rhonchi, stridor, wheezes, or friction rubs appreciated.  BREASTS:  Not pertinent, not performed.  HEART:  Regular S1, S2.  Regular rate and rhythm.  No murmurs, gallops, or rubs noted.  ABDOMEN:  Soft to palpation.  Positive bowel sounds throughout.  Nontender, nondistended.  No organomegaly noted.  GENITOURINARY:  Not pertinent, not performed.  EXTREMITIES:  Right hip flexion approximately 85 degrees.  No internal or external rotation.  Abduction approximately 10 degrees.  He has some shortening of the right lower extremity as compared to the left. Neurovascular intact.  Sensation intact to light touch.  Distal motor function is intact.  SKIN:  Intact.  Without any lesions or rashes.  LABORATORY DATA:  X-rays shows erosion with bone-on-bone changes in his right hip.  IMPRESSION: 1. Right hip severe osteoarthritis requiring total hip replacement. 2. Hypertension. 3. Hypothyroidism. 4. History of anemia.  PLAN:  Admit to [facility] on October 11, 2001, for a right total hip arthroplasty to be done by Dr. Ollen Gross.  The patients primary care physician is Dr. Shana Chute.  The patient will  be contacting Dr. Shana Chute to forward [place] a letter of medical clearance.  That is still pending at this time. Dictated by:   Sammuel Cooper. Mahar, P.A. Attending:  Ollen Gross, M.D. DD:  10/05/01 TD:  10/05/01 Job: [id number]

## 2010-11-15 NOTE — Op Note (Signed)
NAMEANTWONE, CAPOZZOLI NO.:  [id number]   MEDICAL RECORD NO.:  [id number]          PATIENT TYPE:  OIB   LOCATION:  2899                         FACILITY:  [facility]   PHYSICIAN:  Larina Earthly, M.D.    DATE OF BIRTH:  1927-03-24   DATE OF PROCEDURE:  12/30/2004  DATE OF DISCHARGE:                                 OPERATIVE REPORT   PREOPERATIVE DIAGNOSIS:  Gangrene of right second toe.   POSTOPERATIVE DIAGNOSIS:  Gangrene of right second toe.   PROCEDURE:  Amputation of right second toe.   SURGEON:  Larina Earthly, M.D.   ASSISTANT:  Nurse.   ANESTHESIA:  LMA.   COMPLICATIONS:  None.   DISPOSITION:  To recovery room, stable.   PROCEDURE IN DETAIL:  The patient was taken to the operating room and placed  in the spine position.  The area of the right foot was prepped and draped in  a sterile fashion.  A fishmouth type  incision was made at the base of the  toe and carried down to isolate the proximal phalanx.  The bone was divided  with bone shears and was resected further proximally with a rongeur.  The  wound was irrigated with saline.  Hemostasis with cautery.  The specimen was  passed off the field.  The subcutaneous tissue was closed with interrupted 3-  0 Vicryl sutures, and the skin was closed with mattress 3-0 nylon sutures.  A sterile dressing was applied.  The patient was taken to the recovery room  in stable condition.       TFE/MEDQ  D:  12/30/2004  T:  12/30/2004  Job:  [id number]

## 2010-11-15 NOTE — Op Note (Signed)
   NAMECHIVAS, NOTZ NO.:  [id number]   MEDICAL RECORD NO.:  [id number]                   PATIENT TYPE:  OIB   LOCATION:  2899                                 FACILITY:  [facility]   PHYSICIAN:  Larina Earthly, M.D.                 DATE OF BIRTH:  [birth year]   DATE OF PROCEDURE:  12/30/2002  DATE OF DISCHARGE:                                 OPERATIVE REPORT   PREOPERATIVE DIAGNOSIS:  Poorly healing open right great toe amputation.   POSTOPERATIVE DIAGNOSIS:  Poorly healing open right great toe amputation.   PROCEDURE:  Debridement of right great toe amputation.   SURGEON:  Larina Earthly, M.D.   ASSISTANT:  Nurse   ANESTHESIA:  Ankle block.   COMPLICATIONS:  None.   CONDITION:  To the recovery room stable.   PROCEDURE IN DETAIL:  The patient was taken to the operating room and placed  in supine position where the area of the right foot was prepped and draped  in the usual sterile fashion.  The open area around the edges of the open  amputation were debrided with a 10 blade.  There did not appear to be any  evidence of deep infection or necrotic tissue deep to the edges of the  wound.  All the nonviable tissues were debrided.  The wounds were copiously  irrigated with antibiotic saline.  Hemostasis was obtained with  electrocautery.  The specimen was sent for culture of the debrided tissue.  A sterile dressing was applied.  The patient was taken to the recovery room  in stable condition.                                               Larina Earthly, M.D.    TFE/MEDQ  D:  12/30/2002  T:  12/30/2002  Job:  [id number]
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
## 2010-11-15 NOTE — Discharge Summary (Signed)
[facility]  Patient:    [name], [name] Visit Number: [id number] MRN: [id number]          Service Type: [facility] Location: [id number] Attending Physician:  Faith Rogue T Dictated by:   Clarene Reamer, P.A. Admit Date:  12/09/2001 Discharge Date: 12/13/2001                             Discharge Summary  ADMITTING DIAGNOSES: 1. Joint pain, left leg. 2. Osteoarthritis. 3. Thrombocytopenia. 4. Status post hip joint replacement. 5. Hypothyroidism. 6. Hypertension. 7. Alcohol abuse in remission.  DISCHARGE DIAGNOSES: 1. Joint pain, left leg, status post left total knee replacement. 2. Osteoarthritis. 3. Thrombocytopenia. 4. Status post hip joint replacement. 5. Hypothyroidism. 6. Hypertension. 7. Alcohol abuse in remission.  PROCEDURE:  The patient was taken to the operating room on December 06, 2001 and underwent left total knee replacement, surgeon was Dr. Ollen Gross, assistant is Avel Peace, P.A.-C. The surgery was done under general anesthesia and a Hemovac drain x1 was placed at the time of surgery.  CONSULTATIONS:  Physical therapy, occupational therapy, rehabilitation and discharge planning.  BRIEF HISTORY:  The patient is a 77 year old black male who is status post right total knee replacement in April 2003. He has ongoing pain and dysfunction of the left knee secondary to severe end-stage tricompartment arthritis of the left knee. X-rays showed progressive bone on bone deformity and also valgus deformity now causing him a poor quality of life and he is unable to maintain the level of activity which he wishes. He feels he will benefit from a left total knee arthroplasty and is being admitted to undergo the procedure.  LABORATORY DATA:  CBC on admission showed a hemoglobin of 10.6 and hematocrit of 31.3, white blood cell count of 3.6, red blood count of 3.07. Serial H&Hs were followed throughout the hospital course. His  hemoglobin and hematocrit had declined to 8.4 and 24.6 on December 08, 2001; however, he was given 2 units of blood transfusion on December 09, 2001 and hemoglobin was up to 9.9 and hematocrit was up to 28.4. The differential on admission was all within normal limits. PT, INR and PTT on admission were all within normal limits. On December 09, 2001, PT was 20.9 and INR was 2.0 on Coumadin therapy. Routine chemistry on admission was all within normal limits. On June 10, the glucose had risen slightly to 148; however, on June 11 glucose dropped back down to 122. Urinalysis on admission showed 30 mg per deciliter of protein and slight amount of bilirubin. The patients blood type was O positive and antibody screen was negative.  EKG showed normal sinus rhythm with marked sinus arrhythmia, voltage criteria for left ventricular hypertrophy. Preoperative films showed progressive bone on bone deformity and valgus deformity, severe tricompartment arthritis of the left knee.  HOSPITAL COURSE:  The patient was admitted to [facility] and taken to the operating room. He underwent the above stated procedure without any complications. The patient tolerated the procedure well and was allowed to return to the recovery room and then to the orthopedic floor to continue postoperative care. A Hemovac drain was placed at the time of surgery and was pulled on postoperative day one. He was placed on PC analgesia for pain control and the PCA was kept until postop day #2 when he was weaned over to p.o. analgesics. Hemoglobin and hematocrit were followed very closely. He did have  a drop in hemoglobin and hematocrit postoperatively; however, was transfused with two units of packed red blood cells and on December 08, 2001 and his hemoglobin and hematocrit had risen after the transfusion. The patient was seen on December 07, 2001 which was postoperative day one doing well. A rehab consult was obtained at this time. He was to  see physical therapy and occupational therapy on this day. The patient was then seen on December 08, 2001. The patient was doing well, dressing was changed. Also on June 11, rehab saw the patient and was recommended for rehab. The patient was then seen on December 09, 2001 sitting up a chair and no complaints at this time. He was a little slow with therapy. Dressing was changed this day and then on December 09, 2001 he was then discharged to [facility].  DISPOSITION:  The patient was discharged to [facility] Floor on December 09, 2001.  DISCHARGE MEDICATIONS:  1. Trinsicon 1 cap p.o. t.i.d.  2. Oxycodone/acetaminophen 1-2 tabs p.o. q. 4-6 p.r.n.  3. Tylenol 325-650 mg p.o. q. 4h p.r.n.  4. Robaxin 500 mg p.o. q. 6h p.r.n.  5. Restoril 15-30 mg p.o. q.h.s. p.r.n.  6. Coumadin per pharmacy protocol.  7. Laxative of choice.  8. Enema of choice.  9. Synthroid 88 mcg p.o. q.d. 10. Norvasc 5 mg p.o. q.d. 11. Hydrochlorothiazide 12.5 mg p.o. q.d. 12. Avapro 300 mg p.o. q.d.  DISCHARGE DIET:  As tolerated.  DISCHARGE ACTIVITY:  Total knee precautions, weightbearing as tolerated to the left lower extremity.  FOLLOW-UP:  Two weeks from surgery with Dr. Lequita Halt, he is to call the office for an appointment.  CONDITION ON DISCHARGE:  Stable and improved. Dictated by:   Clarene Reamer, P.A. Attending Physician:  Faith Rogue T DD:  12/25/34 TD:  12/26/01 Job: [id number]
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
## 2010-11-15 NOTE — Assessment & Plan Note (Signed)
[facility]                         GASTROENTEROLOGY OFFICE NOTE   NAME:[name], [name]                        MRN:          [id number]  DATE:07/29/2006                            DOB:          1927/05/08    REASON FOR CONSULTATION:  Anemia.  [name] is a pleasant 77 year old  African American male referred through the courtesy of Dr. Artist Pais for  evaluation.  Recent blood work demonstrated a hemoglobin of 8.4, MCV 83  and RDW 19.  [name] takes supplementary iron.  There is no history of  hematochezia.  He does note dark stools coincident with his iron.  He is  a heavy alcohol user.  He complains of very poorly described upper  abdominal discomfort.  He suffers from mild chronic constipation.  He  denies dysphagia, though he complains of persistent phlegm and choking.   PAST MEDICAL HISTORY:  Pertinent for hypertension and arthritis.   FAMILY HISTORY:  Noncontributory.   MEDICATIONS:  Include Avapro, Plavix, Caduet, Protonix, baby aspirin,  Coreg, potassium, iron and Dulcolax.   ALLERGIES:  HE HAS NO ALLERGIES.   He does not smoke.  He does drink socially.   REVIEW OF SYSTEMS:  Positive for shortness of breath.   EXAMINATION:  Pulse 96.  Blood pressure 168/90.  Weight 199.6.  HEENT: EOMI. PERRLA. Sclerae are anicteric.  Conjunctivae are pink.  NECK:  Supple without thyromegaly, adenopathy or carotid bruits.  CHEST:  Clear to auscultation and percussion without adventitious  sounds.  CARDIAC:  Regular rhythm; normal S1 S2.  There are no murmurs, gallops  or rubs.  ABDOMEN:  Bowel sounds are normoactive.  Abdomen is soft, non-tender and  non-distended.  There are no abdominal masses, tenderness, splenic  enlargement or hepatomegaly.  EXTREMITIES:  Full range of motion.  No cyanosis, clubbing or edema.  RECTAL:  Deferred.   IMPRESSION:  Anemia.  While this may be anemia of chronic disease, I am  concerned about iron deficiency anemia, given  his relatively low MCV in  the setting of iron supplementation.  He is also apparently a heavy  alcohol user, which could cause bone marrow suppression or gastritis.  Quantity of blood loss is a concern.   RECOMMENDATION:  1. Serial Hemoccults.  2. Upper endoscopy and colonoscopy.     Barbette Hair. Arlyce Dice, MD,FACG  Electronically Signed    RDK/MedQ  DD: 07/29/2006  DT: 07/29/2006  Job #: [id number]

## 2010-11-15 NOTE — H&P (Signed)
[facility]  Patient:    [name], [name] Visit Number: [id number] MRN: [id number]          Service Type: SUR Location: 4W [id number] [id number] Attending Physician:  Loanne Drilling Dictated by:   Della Goo, P.A. Admit Date:  10/11/2001 Discharge Date: 10/16/2001                           History and Physical  CHIEF COMPLAINT:  Left knee pain.  HISTORY OF PRESENT ILLNESS:  The patient is a 77 year old black male who is status post right total hip replacement in April 2003.  He has ongoing pain and dysfunction of the left knee secondary to severe end-stage tricompartmental arthritis of the left knee.  X-rays have continued to show progressive bone-on-bone deformity and also valgus deformity.  This now is causing him to have poor quality of life, and he is unable to maintain the level of activity of which he wishes.  It is felt that he will benefit from a left total knee arthroplasty and is being admitted to undergo the procedure at this time.  CURRENT MEDICATIONS: 1. Synthroid 88 mcg q.d. 2. Norvasc 5 mg q.d. 3. Hydrochlorothiazide 12.5 mg q.d. 4. Avapro 300 mg q.d.  ALLERGIES:  No known drug allergies.  PAST MEDICAL HISTORY:  The patients past medical history is taken from his previous history and physical as well as some records from Dr. Shana Chute.  The patient is able to discuss these diagnoses, however, seems to be somewhat of a poor historian.  Records indicate he has: 1. History of hypertension. 2. Hypothyroidism. 3. Anemia of unknown origin. 4. History of pancreatitis. 5. Peripheral vascular disease. 6. Thrombocytopenia. 7. The patient is a recovered alcoholic.  PAST SURGICAL HISTORY:  Right total hip arthroplasty in April 2003.  He states he has had no other surgeries through his life.  SOCIAL HISTORY:  The patient denies use of tobacco, and no current alcohol use.  He lives with his wife at home.  Unfortunately, his wife has cancer  and will be going to [place], [place] this week to undergo a hysterectomy.  They have two daughters and a son who live in [place], and she is going there to have surgery so that her children may take care of her.  This leaves the patient by himself, and he will be at home alone postoperatively.  He did not require inpatient rehabilitation with his total hip replacement and utilized [facility] therapies.  He is hopeful he may do this again after his total knee replacement.  FAMILY HISTORY:  The patient had a brother who died of throat cancer from smoking and a sister who died of lung cancer from smoking.  The patients mother had a stroke and is deceased.  The patient does not know his fathers medical history.  REVIEW OF SYSTEMS:  CNS:  The patient denies blurred vision, double vision, seizure disorder, headaches, or paralysis.  CARDIORESPIRATORY:  No chest pain, shortness of breath, cough, sputum production, or hemoptysis. GENITOURINARY/GASTROINTESTINAL:  No nausea, vomiting, diarrhea, constipation. No dysuria, hematuria, melena, or bloody stools.  MUSCULOSKELETAL:  As per history of present illness.  HEMATOLOGIC:  The patient denies history of bleeding disorders, blood transfusions, or jaundice.  PHYSICAL EXAMINATION:  VITAL SIGNS:  Blood pressure 138/78, pulse 68 and regular.  GENERAL:  Well-developed, well-nourished black male who is alert and oriented x4.  In no acute distress.  HEENT:  Normocephalic, atraumatic.  Pupils are equal, round, and reactive to light and accommodation.  Extraocular movements intact.  Nose without drainage.  Oropharynx without edema or erythema.  NECK:  Supple.  No adenopathy or thyromegaly.  LUNGS:  Clear to auscultation.  HEART:  Sinus arrhythmia.  No murmur is heard.  No gallop or rub is heard.  ABDOMEN:  Soft and nontender.  Bowel sounds are present.  GENITOURINARY, RECTAL, BREASTS:  Not performed, not pertinent to  present illness.  EXTREMITIES:  Examination of the left knee reveals a valgus deformity.  The patient has an antalgic gait secondary to his left knee.  Range of motion is 5-110 degrees.  There is no ligamentous instability.  There is marked crepitus and joint line tenderness throughout the left knee.  Distally, sensation and pulses are intact throughout both lower extremities.  There is no cyanosis, clubbing, or edema noted in the lower extremities.  IMPRESSION: 1. End-stage osteoarthritis left knee. 2. Hypertension. 3. Hypothyroidism. 4. History of anemia. 5. History of pancreatitis. 6. Peripheral vascular disease. 7. Thrombocytopenia.  All obtained from Dr. Ronie Spies medical records.  PLAN:  The patient will be admitted to the hospital to undergo a left total knee arthroplasty.  He will be seen by Dr. Shana Chute preoperatively for medical clearance.  We will attempt to utilize home health physical therapy postoperatively.  However, being that he will be completely alone at home, we may consider inpatient rehabilitation, depending on his progress during the hospital stay. Dictated by:   Della Goo, P.A. Attending Physician:  Loanne Drilling DD:  12/02/01 TD:  12/03/01 Job: [id number]
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
## 2010-11-15 NOTE — Op Note (Signed)
   NAME:  [name], [name] NO.:  [id number]   MEDICAL RECORD NO.:  [id number]                   PATIENT TYPE:  OIB   LOCATION:  5018                                 FACILITY:  [facility]   PHYSICIAN:  Larina Earthly, M.D.                 DATE OF BIRTH:  [birth year]   DATE OF PROCEDURE:  11/25/2002  DATE OF DISCHARGE:                                 OPERATIVE REPORT   PREOPERATIVE DIAGNOSIS:  Dry gangrene of right first metatarsal head.   POSTOPERATIVE DIAGNOSIS:  Dry gangrene of right first metatarsal head.   PROCEDURE:  Right great toe amputation with a ray amputation.   SURGEON:  Larina Earthly, M.D.   ANESTHESIA:  Ankle block.   COMPLICATIONS:  None.   DISPOSITION:  To recovery room stable.   DESCRIPTION OF PROCEDURE:  The patient was taken to the operating room and  placed in the supine position where the area of the right foot was prepped  and draped in the usual sterile fashion.  Initially the area over the  metatarsal head was debrided and was found to go down to the joint space.  For this reason, the decision was made to proceed with ray amputation as  discussed with the patient and his family.  Using a racket-type incision,  the first metatarsal head was exposed. The metatarsal bone was transected  and using a rongeur was resected further proximally.  The toe was passed off  the field.  The tendons were also debrided back below the level of the skin  incision. The patient had very good bleeding to this level. The wound was  irrigated with saline.  Hemostasis with electrocautery.  The wound was  closed with several interrupted 3-0 Vicryl sutures to reapproximate the  subcutaneous tissue.  Next, the skin was closed with 3-0 nylon mattress  sutures.  A sterile dressing was applied and the patient was taken to the  recovery room in stable condition.                                               Larina Earthly, M.D.    TFE/MEDQ  D:  11/25/2002   T:  11/26/2002  Job:  [id number]

## 2010-11-15 NOTE — Op Note (Signed)
NAMERJ, [name]                      ACCOUNT NO.:  [id number]   MEDICAL RECORD NO.:  [id number]                   PATIENT TYPE:  INP   LOCATION:  2871                                 FACILITY:  [facility]   PHYSICIAN:  Larina Earthly, M.D.                 DATE OF BIRTH:  [birth year]   DATE OF PROCEDURE:  10/26/2002  DATE OF DISCHARGE:                                 OPERATIVE REPORT   PREOPERATIVE DIAGNOSIS:  Ischemia right foot with nonhealing ulcerations of  right metatarsal head and right great toe.   POSTOPERATIVE DIAGNOSIS:  Ischemia right foot with nonhealing ulcerations of  right metatarsal head and right great toe.   PROCEDURE:  Right femoral-to-below-knee-popliteal bypass with translocated  non-reversed saphenous vein.   SURGEON:  Larina Earthly, M.D.   ASSISTANT:  Toribio Harbour, R.N.   ANESTHESIA:  General endotracheal.   COMPLICATIONS:  None.   DISPOSITION:  To recovery room, stable.   PROCEDURE IN DETAIL:  The patient was taken to the operating room and placed  in supine position, where the area of the right groin and right leg were  prepped and draped in a sterile fashion.  The incision was made over the  femoral pulse and carried down to isolate the common, superficial femoral  and profunda femoris arteries.  The saphenous vein was identified at the  saphenofemoral junction.  Tributary branches were ligated with 2-0 and 3-0  silk ties and divided.  The vein was then removed from the level of the  groin to the mid-calf and was of good caliber.  Tributary branches were  ligated with 3-0 and 4-0 silk ties and divided.  The below-knee popliteal  artery was exposed in the medial approach in the below-knee position.  The  artery was thickened but was patent.  Preoperative arteriography revealed  complete occlusion of all trifurcation vessels with reconstitution of a  distal diseased anterior tibial artery at the ankle.  The vein was harvested  by ligating  it distally and dividing it.  The saphenofemoral junction was  occluded with a Cooley clamp and the saphenous vein was excised from the  saphenofemoral junction and was then oversewn with a 5-0 Prolene suture.  A  tunnel was created from the level of the below-knee popliteal space to the  groin.  The patient was given 7000 units of intravenous heparin and after  adequate replacement time, the common, superficial femoral and profunda  femoris arteries were occluded.  The common femoral artery was opened with  an 11 blade and extended longitudinally with Potts scissors.  The vein was  spatulated and sewn end-to-side to the artery with a running 6-0 Prolene  suture.  The anastomosis was tested and found to be adequate.  Next, the  vein valves were lysed with the [employer].  The vein was brought  through the prior created tunnel to the below-knee popliteal position.  A  pneumatic tourniquet was placed in the above-knee position.  The leg was  exsanguinated with an Esmarch tourniquet and the pneumatic tourniquet was  inflated to 250 mmHg.  The below-knee popliteal artery was opened with an 11  blade and extended longitudinally with Potts scissors.  The vein graft was  cut to the appropriate length, spatulated and sewn end-to-side to the artery  with a #6-0 Prolene suture.  Prior to completion of the anastomosis, the  tourniquet was deflated and the usual flushing maneuvers were undertaken.  The anastomosis was occluded and the graft-dependent Doppler flow was noted  at the level of the posterior tibial artery.  The patient  was given 15 mg of protamine to reverse the heparin.  The wounds were  irrigated with saline and hemostased with electrocautery.  Wounds were  closed with 3-0 Vicryl and 2-0 Vicryl in the subcutaneous tissue.  The skin  was closed with skin clips.  A sterile dressing was applied and the patient  was taken to the recovery room in stable condition.                                                Larina Earthly, M.D.    TFE/MEDQ  D:  10/26/2002  T:  10/26/2002  Job:  [id number]

## 2010-11-20 NOTE — Procedures (Unsigned)
BYPASS GRAFT EVALUATION  INDICATION:  Follow up bypass graft.  HISTORY: Diabetes:  No. Cardiac:  No. Hypertension:  Yes. Smoking:  Previous. Previous Surgery:  Right femoral to popliteal bypass graft, 10/18/2002.  SINGLE LEVEL ARTERIAL EXAM                              RIGHT              LEFT Brachial:                    142                139 Anterior tibial:             94                 108 Posterior tibial:            98                 112 Peroneal: Ankle/brachial index:        0.69               0.79  PREVIOUS ABI:  Date: 11/09/2009  RIGHT:  0.58  LEFT:  0.66  LOWER EXTREMITY BYPASS GRAFT DUPLEX EXAM:  DUPLEX: 1. Patent right femoral to popliteal bypass graft with no evidence of     stenosis within the bypass graft. 2. Increased velocity of 254 cm/s noted in the right native outflow     artery.  IMPRESSION: 1. Stable ankle brachial indices bilaterally, suggestive of moderate     arterial disease. 2. Patent right femoral to popliteal bypass graft with an elevated     velocity in the native outflow artery, as described above.  ___________________________________________ Larina Earthly, M.D.  EM/MEDQ  D:  11/12/2010  T:  11/12/2010  Job:  [id number]

## 2010-11-27 ENCOUNTER — Encounter: Payer: Self-pay | Admitting: Internal Medicine

## 2010-11-28 ENCOUNTER — Ambulatory Visit: Payer: Medicare Other | Admitting: Internal Medicine

## 2010-11-28 DIAGNOSIS — Z0289 Encounter for other administrative examinations: Secondary | ICD-10-CM

## 2010-12-12 ENCOUNTER — Telehealth: Payer: Self-pay | Admitting: *Deleted

## 2010-12-12 MED ORDER — ZOSTER VACCINE LIVE 19400 UNT/0.65ML ~~LOC~~ SOLR: 0.6500 mL | Freq: Once | SUBCUTANEOUS | Status: AC

## 2010-12-12 NOTE — Telephone Encounter (Signed)
Patient requesting RX for walker with seat and handbrakes.

## 2010-12-12 NOTE — Telephone Encounter (Signed)
Rx done and at your desk. 

## 2010-12-12 NOTE — Telephone Encounter (Signed)
Daughter aware, pt is moving to [place]. Provided copy of med list.

## 2010-12-12 NOTE — Telephone Encounter (Signed)
Pt needs RX for shingles vaccine. Done, need to call daughter to inform.

## 2010-12-13 ENCOUNTER — Encounter: Payer: Self-pay | Admitting: Internal Medicine

## 2010-12-13 ENCOUNTER — Inpatient Hospital Stay (HOSPITAL_COMMUNITY)
Admission: AD | Admit: 2010-12-13 | Discharge: 2010-12-20 | DRG: 475 | Disposition: A | Discharge status: Skilled Nursing Facility | Payer: Medicare Other | Source: Ambulatory Visit | Attending: Internal Medicine | Admitting: Internal Medicine

## 2010-12-13 ENCOUNTER — Ambulatory Visit (HOSPITAL_COMMUNITY): Payer: Medicare Other | Admitting: Internal Medicine

## 2010-12-13 ENCOUNTER — Telehealth: Payer: Self-pay | Admitting: *Deleted

## 2010-12-13 ENCOUNTER — Inpatient Hospital Stay (HOSPITAL_COMMUNITY): Payer: Medicare Other

## 2010-12-13 DIAGNOSIS — A4902 Methicillin resistant Staphylococcus aureus infection, unspecified site: Secondary | ICD-10-CM | POA: Diagnosis present

## 2010-12-13 DIAGNOSIS — Z8501 Personal history of malignant neoplasm of esophagus: Secondary | ICD-10-CM

## 2010-12-13 DIAGNOSIS — L02619 Cutaneous abscess of unspecified foot: Secondary | ICD-10-CM | POA: Diagnosis present

## 2010-12-13 DIAGNOSIS — F1010 Alcohol abuse, uncomplicated: Secondary | ICD-10-CM | POA: Diagnosis present

## 2010-12-13 DIAGNOSIS — J4489 Other specified chronic obstructive pulmonary disease: Secondary | ICD-10-CM | POA: Diagnosis present

## 2010-12-13 DIAGNOSIS — J449 Chronic obstructive pulmonary disease, unspecified: Secondary | ICD-10-CM | POA: Diagnosis present

## 2010-12-13 DIAGNOSIS — I10 Essential (primary) hypertension: Secondary | ICD-10-CM

## 2010-12-13 DIAGNOSIS — I739 Peripheral vascular disease, unspecified: Secondary | ICD-10-CM | POA: Diagnosis present

## 2010-12-13 DIAGNOSIS — K219 Gastro-esophageal reflux disease without esophagitis: Secondary | ICD-10-CM | POA: Diagnosis present

## 2010-12-13 DIAGNOSIS — D649 Anemia, unspecified: Secondary | ICD-10-CM

## 2010-12-13 DIAGNOSIS — K861 Other chronic pancreatitis: Secondary | ICD-10-CM | POA: Diagnosis present

## 2010-12-13 DIAGNOSIS — L03039 Cellulitis of unspecified toe: Secondary | ICD-10-CM | POA: Diagnosis present

## 2010-12-13 DIAGNOSIS — Z85118 Personal history of other malignant neoplasm of bronchus and lung: Secondary | ICD-10-CM

## 2010-12-13 DIAGNOSIS — Z8673 Personal history of transient ischemic attack (TIA), and cerebral infarction without residual deficits: Secondary | ICD-10-CM

## 2010-12-13 DIAGNOSIS — IMO0001 Reserved for inherently not codable concepts without codable children: Secondary | ICD-10-CM | POA: Diagnosis present

## 2010-12-13 DIAGNOSIS — D500 Iron deficiency anemia secondary to blood loss (chronic): Secondary | ICD-10-CM | POA: Diagnosis present

## 2010-12-13 DIAGNOSIS — Z96649 Presence of unspecified artificial hip joint: Secondary | ICD-10-CM

## 2010-12-13 DIAGNOSIS — E785 Hyperlipidemia, unspecified: Secondary | ICD-10-CM

## 2010-12-13 DIAGNOSIS — M86179 Other acute osteomyelitis, unspecified ankle and foot: Secondary | ICD-10-CM

## 2010-12-13 DIAGNOSIS — M869 Osteomyelitis, unspecified: Principal | ICD-10-CM | POA: Diagnosis present

## 2010-12-13 NOTE — Progress Notes (Signed)
Subjective:    Patient ID: CYRUSS ARATA, male    DOB: 1927-06-25, 77 y.o.   MRN: [id number]  HPI Mr. Witczak presents acutely for a draining foot wound on the eve of moving to [place]. He has a h/o PVD and has had Great toe amputated right foot. He was seen April 23rd and was noted to have a non-infected callus on the plantar aspect of his right foot. In the interval he has developed swelling and pain of the right foot but was not aware of drainage. On exam he has maceration between the two toes with purulent drainage, bad odder and swelling of the distal aspect of the toe. He has no fever or chills.  Past Medical History  Diagnosis Date  . Anemia   . Hypertension   . Alcohol abuse   . COPD (chronic obstructive pulmonary disease)   . PVD (peripheral vascular disease)   . Alcoholic pancreatitis 11/2006, 11/2128  . Rib fracture   . Osteomyelitis of foot 05/2008    Right   Past Surgical History  Procedure Date  . Total hip arthroplasty 2003  . Total knee arthroplasty 12/02    Left  . Toe amputation 10/2002    Great toe  . Femoral-popliteal bypass graft 2004  . Partial ray amputation right foot, 1st metatarsal 12/09   Family History  Problem Relation Age of Onset  . Stroke Mother   . Cancer Sister     lung  . Cancer Brother     throat   History   Social History  . Marital Status: Widowed    Spouse Name: N/A    Number of Children: 5  . Years of Education: N/A   Occupational History  . Scientist, product/process development    Social History Main Topics  . Smoking status: Former Smoker    Types: Cigarettes    Quit date: 12/12/1972  . Smokeless tobacco: Never Used   Comment: Quit in late 23's  . Alcohol Use: No     History of heavy drinking  . Drug Use: No  . Sexually Active: Not on file   Other Topics Concern  . Not on file   Social History Narrative   7th grade. Married - '56 - 48yrs/widowed. 3 sons, 2 dtrs, 14 grandchildren. Lives with family.No life support, no CPR.       Review  of Systems Review of Systems  Constitutional:  Negative for fever, chills, activity change and unexpected weight change.  HEENT:  Negative for hearing loss, ear pain, congestion, neck stiffness and postnasal drip. Negative for sore throat or swallowing problems. Negative for dental complaints.   Eyes: Negative for vision loss or change in visual acuity.  Respiratory: Negative for chest tightness and wheezing.   Cardiovascular: Negative for chest pain and palpitation. No decreased exercise tolerance Gastrointestinal: No change in bowel habit. No bloating or gas. No reflux or indigestion Genitourinary: Negative for urgency, frequency, flank pain and difficulty urinating.  Musculoskeletal: Negative for myalgias, back pain. Diffuse OA, Limited ability to ambulate - uses rolling walker. Neurological: Negative for dizziness, tremors, weakness and headaches.  Hematological: Negative for adenopathy.  Psychiatric/Behavioral: Negative for behavioral problems and dysphoric mood.       Objective:   Physical Exam Filed Vitals:   12/13/10 1702  BP: 178/78  Pulse: 73  Temp: 97 F (36.1 C)  Resp: 14  Gen'l - thin elderly AA man in no acute distress HEENT - C&S clear, PERRLA, no oral lesions Neck - supple  Nodes - negative Chest - CTAP Cor - RRR no murmurs Abd - soft, BS+, no guarding or rebound Genitalia and rectal - deferred Ext - missing great and second toe right foot. 3rd toe right foot swollen, red, macerated at lateral aspect with frank purulent drainage. Remaining extremities normal. Derm - see above. Dead skin on plantar aspect right foot with no open lesions Neuro - A&O x 3, CN II-XII normal, non-focal exam.       Assessment & Plan:

## 2010-12-13 NOTE — Assessment & Plan Note (Signed)
H/O COPD currently asymptomatic.

## 2010-12-13 NOTE — Telephone Encounter (Signed)
rx upfront/ daughter aware.

## 2010-12-13 NOTE — Assessment & Plan Note (Signed)
Chronic anemia with recent exacerbation from GI bleed.  Plan - routine CBC

## 2010-12-13 NOTE — Telephone Encounter (Signed)
rx written and put upfront. Daughter informed and advised her Dr. Debby Bud will see pt today.

## 2010-12-13 NOTE — Assessment & Plan Note (Signed)
Very poor control but he has been out of his amlodipine for an indeterminate period of time.  Plan - resume amlodipine and monitor BP

## 2010-12-13 NOTE — Assessment & Plan Note (Signed)
Chronic problem  Plan - routine lab

## 2010-12-13 NOTE — Telephone Encounter (Signed)
Pt is moving to [facility] with his daughter and is leaving tom. He needs a Rx for a walker with seat. Also, he has callus on foot that you stated needs to be looked at. Who needs to f/u on this?

## 2010-12-13 NOTE — Assessment & Plan Note (Signed)
Patient with a h/o PVD and prior osteomyelitis. He now presents with an infected toe with deep purulent drainage with high degree of suspicion for osteomyelitis of the 3rd toe.  Plan - x-ray and MRI of the right foot.           Cleansing of the foot and dry dressing between toes.           Doxycylcine 100mg  bid           Surgical consult if MRI is positive.

## 2010-12-14 ENCOUNTER — Inpatient Hospital Stay (HOSPITAL_COMMUNITY): Payer: Medicare Other

## 2010-12-14 DIAGNOSIS — D649 Anemia, unspecified: Secondary | ICD-10-CM

## 2010-12-14 DIAGNOSIS — I10 Essential (primary) hypertension: Secondary | ICD-10-CM

## 2010-12-14 DIAGNOSIS — J449 Chronic obstructive pulmonary disease, unspecified: Secondary | ICD-10-CM

## 2010-12-14 DIAGNOSIS — M869 Osteomyelitis, unspecified: Secondary | ICD-10-CM

## 2010-12-14 LAB — CBC
HCT: 24.6 % — ABNORMAL LOW (ref 39.0–52.0)
Hemoglobin: 8.1 g/dL — ABNORMAL LOW (ref 13.0–17.0)
MCV: 87.2 fL (ref 78.0–100.0)
RBC: 2.82 MIL/uL — ABNORMAL LOW (ref 4.22–5.81)
RDW: 22.9 % — ABNORMAL HIGH (ref 11.5–15.5)
WBC: 4.8 10*3/uL (ref 4.0–10.5)

## 2010-12-14 LAB — BASIC METABOLIC PANEL
BUN: 19 mg/dL (ref 6–23)
CO2: 23 mEq/L (ref 19–32)
Chloride: 109 mEq/L (ref 96–112)
Creatinine, Ser: 1.41 mg/dL — ABNORMAL HIGH (ref 0.50–1.35)
GFR calc Af Amer: 58 mL/min — ABNORMAL LOW (ref >60)
Glucose, Bld: 101 mg/dL — ABNORMAL HIGH (ref 70–99)
Potassium: 3.8 mEq/L (ref 3.5–5.1)

## 2010-12-14 LAB — FERRITIN: Ferritin: 28 ng/mL (ref 22–322)

## 2010-12-14 LAB — LIPID PANEL
HDL: 53 mg/dL (ref >39)
Total CHOL/HDL Ratio: 2.7 RATIO
VLDL: 14 mg/dL (ref 0–40)

## 2010-12-14 LAB — IRON AND TIBC
Iron: 51 ug/dL (ref 42–135)
Saturation Ratios: 23 % (ref 20–55)

## 2010-12-14 LAB — RETICULOCYTES
RBC.: 2.99 MIL/uL — ABNORMAL LOW (ref 4.22–5.81)
Retic Count, Absolute: 32.9 10*3/uL (ref 19.0–186.0)

## 2010-12-14 LAB — MRSA PCR SCREENING: MRSA by PCR: POSITIVE — AB

## 2010-12-14 MED ORDER — GADOBENATE DIMEGLUMINE 529 MG/ML IV SOLN
15.0000 mL | Freq: Once | INTRAVENOUS | Status: AC | PRN
  Administered 2010-12-14: 15 mL via INTRAVENOUS

## 2010-12-15 LAB — HEMOGLOBIN AND HEMATOCRIT, BLOOD
HCT: 26.2 % — ABNORMAL LOW (ref 39.0–52.0)
Hemoglobin: 8.7 g/dL — ABNORMAL LOW (ref 13.0–17.0)

## 2010-12-15 LAB — TYPE AND SCREEN: ABO/RH(D): O POS

## 2010-12-16 DIAGNOSIS — M86679 Other chronic osteomyelitis, unspecified ankle and foot: Secondary | ICD-10-CM

## 2010-12-16 LAB — WOUND CULTURE: Gram Stain: NONE SEEN

## 2010-12-16 LAB — HEMOGLOBIN AND HEMATOCRIT, BLOOD: HCT: 25.1 % — ABNORMAL LOW (ref 39.0–52.0)

## 2010-12-17 ENCOUNTER — Other Ambulatory Visit: Payer: Self-pay | Admitting: Orthopedic Surgery

## 2010-12-17 LAB — CBC
HCT: 26.2 % — ABNORMAL LOW (ref 39.0–52.0)
MCHC: 33.2 g/dL (ref 30.0–36.0)
MCV: 87.9 fL (ref 78.0–100.0)
RDW: 23 % — ABNORMAL HIGH (ref 11.5–15.5)

## 2010-12-17 LAB — BASIC METABOLIC PANEL
BUN: 20 mg/dL (ref 6–23)
Creatinine, Ser: 1.54 mg/dL — ABNORMAL HIGH (ref 0.50–1.35)
GFR calc Af Amer: 53 mL/min — ABNORMAL LOW (ref >60)
GFR calc non Af Amer: 43 mL/min — ABNORMAL LOW (ref >60)

## 2010-12-18 LAB — BASIC METABOLIC PANEL
Chloride: 107 mEq/L (ref 96–112)
Creatinine, Ser: 1.39 mg/dL — ABNORMAL HIGH (ref 0.50–1.35)
GFR calc Af Amer: 59 mL/min — ABNORMAL LOW (ref >60)
Potassium: 3.9 mEq/L (ref 3.5–5.1)
Sodium: 136 mEq/L (ref 135–145)

## 2010-12-18 LAB — HEMOGLOBIN AND HEMATOCRIT, BLOOD
HCT: 24 % — ABNORMAL LOW (ref 39.0–52.0)
Hemoglobin: 8 g/dL — ABNORMAL LOW (ref 13.0–17.0)

## 2010-12-19 NOTE — Op Note (Signed)
NAMEJARMEL, [name] NO.:  [id number]  MEDICAL RECORD NO.:  [id number]  LOCATION:  5153                         FACILITY:  [facility]  PHYSICIAN:  Toni Arthurs, MD        DATE OF BIRTH:  [birth year]  DATE OF PROCEDURE:  12/17/2010 DATE OF DISCHARGE:                              OPERATIVE REPORT   PREOPERATIVE DIAGNOSIS:  Right foot osteomyelitis, status post right first and second toe amputations.  HISTORY OF PRESENT ILLNESS:  The patient is an 77 year old male with severe peripheral vascular disease.  He has developed an ulcer at his third toe.  He has previously undergone hallux and second toe amputations for ulcers.  He has been shown to have pyarthrosis of his third MTP joint as well as osteomyelitis of the head of the third metatarsal and the third toe phalanges.  He presents now for operative treatment of this condition.  The choice of a ray resection for this condition is not appropriate given the severely unbalanced and poorly protected foot that would be remaining.  The appropriate choice for this condition is a transmetatarsal amputation along with percutaneous heel cord lengthening.  This will result in a more balanced foot that is less prone to repeat breakdown.  The patient understands the risks and benefits, the alternative treatment options, and would like to proceed.  SPECIMENS:  Forefoot to Pathology, deep tissue to microbiology for culture.  PROCEDURE IN DETAIL:  After preoperative consent was obtained and the correct operative site was identified, the patient was brought to the operating room and placed supine on the operating table.  General anesthesia was induced.  Preoperative antibiotics were administered.  A surgical time-out was taken.  The right lower extremity was prepped and draped in standard sterile fashion.  A 4-inch Esmarch tourniquet was wrapped around the ankle.  [place] incision was marked on the skin with the distal edge  just at the level of the metatarsal heads.  The incision was made and sharp dissection was carried down to the level of the bone.  The subcutaneous tissue, muscle, and skin were elevated with a periosteal elevator.  A sagittal saw was then used to cut through the second through fourth metatarsals.  The amputation was completed sharply and the forefoot was passed off the field as a specimen to Pathology. On the back table, the dorsum of the third toe was incised.  Grossly purulent material was found within the MTP joint and the base of the proximal phalanx was essentially liquefied.  Few mL of this were gathered with a rongeur and sent as a specimen to microbiology for culture.  The tourniquet was released.  Hemostasis was achieved.  The foot was irrigated copiously.  Retention sutures of 2-0 nylon were then used to loosely approximate the skin edges.  A few horizontal mattress sutures were placed as well to keep the skin edges everted.  Sterile dressings were applied followed by a well-padded short-leg splint.  The patient was then awakened from anesthesia and transported to recovery room in stable condition.  FOLLOWUP PLAN:  The patient will be nonweightbearing on this right extremity until a suture removal.  He will  follow up with me in 2-3 weeks in clinic for that.     Toni Arthurs, MD     JH/MEDQ  D:  12/17/2010  T:  12/18/2010  Job:  [id number]  cc:   Almedia Balls. Ranell Patrick, M.D.  Electronically Signed by Jonny Ruiz Jawanza Zambito  on 12/19/2010 12:11:03 PM

## 2010-12-20 ENCOUNTER — Telehealth: Payer: Self-pay | Admitting: *Deleted

## 2010-12-20 NOTE — Telephone Encounter (Signed)
Daughter left vm - She is req FMLA forms to be completed for both of pt's daughters. I advised her to drop off forms and MD will review. Also explained there is fee for completion.

## 2010-12-22 LAB — ANAEROBIC CULTURE: Gram Stain: NONE SEEN

## 2011-01-04 NOTE — Consult Note (Signed)
  [name], ARTH NO.:  [id number]  MEDICAL RECORD NO.:  [id number]  LOCATION:  5153                         FACILITY:  [facility]  PHYSICIAN:  Georges Lynch. Yerachmiel Spinney, M.D.DATE OF BIRTH:  1926/07/02  DATE OF CONSULTATION: DATE OF DISCHARGE:                                CONSULTATION   Dr. Ranell Patrick called me since I was on-call today, he asked me to see one of his patient who was previously operated on regard to the right foot. Apparently, this patient was admitted by Dr. Illene Regulus.  He has a history of an infection in his right foot, basically an MRI was done onto his foot here on December 14, 2010, that revealed diffuse osteomyelitis involving the third metatarsal and proximal phalanx with the septic joint in the metatarsal-phalangeal region.  He has no obvious abscess. He has also had plain x-rays of his foot that revealed acute osteomyelitis involving the third metatarsal head and proximal phalanx of the middle toe.  Basically, his past history, well documented on the chart by Dr. Debby Bud.  The patient has past history of anemia, hypertension, COPD, pancreatitis, and osteomyelitis.  PREVIOUS SURGERY:  He has a total hip arthroplasty, total knee arthroplasty on left and great toe amputation by Dr. Ranell Patrick.  He also had a femoral-popliteal bypass.  He had a ray resection of the right foot.  The review of systems all well documented by Dr. Debby Bud.  He has had underlying history of stroke and cancer of the lung and cancer of the throat.  His blood pressure is 163/66, his heart rate is 70, respirations 16, temperature 98.7.  Oxygen saturation is 100%.  The physical exam from the orthopedic standpoint reveals that he has an obvious infection in his right foot.  He has had no obvious drainage at this time.  He just has swelling and callus formation in his foot.  The ankle per se is fine, and calf is soft, nontender, no signs of any deep venous thrombosis.  As I  mentioned, I did review his x-rays and MRI findings, I discussed those with his daughter and I believe he will need to have a ray resection of that third toe and third metatarsal at least a partial rate, I will consult.  I will notify Dr. Malon Kindle about this.          ______________________________ Georges Lynch. Darrelyn Hillock, M.D.     RAG/MEDQ  D:  12/16/2010  T:  12/17/2010  Job:  [id number]  cc:   [name], MD  Electronically Signed by Ranee Gosselin M.D. on 01/04/2011 08:19:26 AM

## 2011-01-04 NOTE — Discharge Summary (Signed)
NAMECORNIE, HERRINGTON NO.:  [id number]  MEDICAL RECORD NO.:  [id number]  LOCATION:  5153                         FACILITY:  [facility]  PHYSICIAN:  [name], MD  DATE OF BIRTH:  Aug 19, 1926  DATE OF ADMISSION:  12/13/2010 DATE OF DISCHARGE:  12/20/2010                              DISCHARGE SUMMARY   ADMITTING DIAGNOSES: 1. Osteomyelitis of the right foot, toes. 2. Anemia. 3. Hypertension. 4. Chronic obstructive pulmonary disease.  DISCHARGE DIAGNOSES: 1. Osteomyelitis of the right foot, toes. 2. Anemia. 3. Hypertension. 4. Chronic obstructive pulmonary disease.  CONSULTANTS:  For Orthopedics, it will be Dr. Ranee Gosselin and Dr. Victorino Dike.  For Vascular Surgery, Dr. Tawanna Cooler Early.  PROCEDURES: 1. Two-view of the foot with plain x-ray which revealed acute     osteomyelitis involving the third metatarsal head and proximal     phalanx of the middle toe. 2. MRI of the foot without contrast which showed diffuse osteomyelitis     involving the third metatarsal and proximal phalanx with septic     arthritis of the metatarsophalangeal joint.  Myositis and     cellulitis but no discrete drainable soft tissue abscess.  SURGERY:  The patient had transmetatarsal amputation of the right foot and lengthening of Achilles tendon on June 19.  Please see the op note.  HISTORY OF PRESENT ILLNESS:  Mr. Wakeley is well-known to my practice, followed for anemia, hypertension, history of alcohol abuse, COPD, peripheral vascular disease and status post amputation of the great toe and second toe of the right foot previously.  He presented to the office on the eve of moving to [place] when he was noted to have a noninfected callus on the plantar aspect of his right foot which became more swollen and painful.  On presentation to the office, the patient had an acutely infected foot with maceration between the third and fourth toe with purulent drainage, significant malodor, and  swelling of the distal aspect of the toe.  Fortunately, he had no fevers or chills at that time.  He was subsequently admitted with working diagnosis of acute osteomyelitis.  Please see the EMR generated admit note for past medical history, family history, and social history.  HOSPITAL COURSE: 1. Osteomyelitis.  The patient was admitted to hospital.  He was     started on initially Avelox for infection which was switched to     doxycycline.  X-ray and MRI confirmed osteomyelitis.  The patient     was seen in consultation by Dr. Gretta Began for Vascular Surgery.     Mr. Beckerman recently had ABIs which showed he had patency of femoral     graft to his right leg and that he had adequate circulation to     sustain surgery.  The patient was seen in consultation by Dr.     Ranee Gosselin and also by Dr. Victorino Dike who recommended     transmetatarsal amputation of the foot.  The patient was taken to     the operating room on the 19th for such procedure.  He did well     with surgery. 2. The patient had a dressing on his  foot since his operation.  The     dressing remained dry.  Antibiotics were discontinued.  Surgery     recommended the patient have some skilled care and he is to return     to see Dr. Victorino Dike 2 weeks postop for suture removal and wound check     prior to moving to [place]. 3. Anemia.  The patient was admitted to hospital with significant     anemia which was chronic with initial hemoglobin of 8.7 grams.     There were some fluctuations dropping to low of 8 grams but on the     night prior to discharge, he was back to his baseline of 8.6 grams.     He had no sign of obvious bleeding.  The patient had recently been     hospitalized in May for acute upper GI bleed secondary to gastric     ulcers by EGD.  The patient had been on nonsteroidal anti-     inflammatory drug and had not been taking proton pump inhibitor     prior to admission.  During this stay, he was not allowed to have      anti-inflammatory drugs and was put back on Protonix which he does     need to continue long-term.  With the patient having definitive     surgical treatment for his osteomyelitis with no sign of active and     ongoing infection with pain under good control with oral     medications, he is stable and ready for discharge to Skilled Care     Facility.  Plan is for 2 weeks of rehab.  He is to see Dr. Victorino Dike     in followup in 2 weeks in the office for suture removal and wound     check.  DISCHARGE EXAMINATION:  VITAL SIGNS:  Temperature was 97.4, blood pressure 143/62, heart rate 72, respirations 18, oxygen level was 100% on room.  Is and Os were slightly negative. GENERAL APPEARANCE:  This is a tall, gangly, elderly African American gentleman who is in no distress. HEENT:  Normocephalic, atraumatic.  Conjunctivae and sclerae were clear. NECK:  Supple. CHEST:  The patient is moving air well.  There were no rales, wheezes, or rhonchi.  No increased work of breathing.  No use of accessory musculature. CARDIOVASCULAR:  2+ radial pulses.  Precordium was quiet.  He had a regular rate and rhythm.  He had no JVD or carotid bruits.  Distal extremities were warm and on the right above the dressing about midcalf, his leg was warm. ABDOMEN:  Soft.  No guarding or rebound.  He had no tenderness to palpation. NEUROLOGIC:  The patient is awake, alert, oriented to person, place, time, and context, moving all extremities.  FINAL LABORATORY:  Hemoglobin 8.6 grams on June 21 at 1800 hours. Culture of the wound of the patient's foot was positive for moderate Staphylococcus aureus.  Anaerobic culture from the patient's foot wound was no growth.  Final metabolic panel June 20 with a sodium 136, potassium 3.9, chloride 107, CO2 23, BUN 17, creatinine 1.39, glucose was 99.  The patient did have an anemia panel June 16 which showed an iron level of 51 that is normal range.  Total iron binding capacity  was 221 normal range, percent iron saturation was 23% which was normal range.  B12 was 325.  The low end of normal folate was 8.  Reticulocyte count was normal at 1.1% with an  absolute count of 32.9.  The patient had a lipid profile June 16 with a cholesterol of 141, triglycerides of 70, HDL 53, LDL was 74.  DISCHARGE MEDICATIONS:  Please see the medication discharge manager.  FINAL DISPOSITION:  Transferred to skilled care with the patient to move to [place] following his rehabilitation.  The patient's condition at time of discharge dictation is medically stable and improved.     Rosalyn Gess Norins, MD     MEN/MEDQ  D:  12/20/2010  T:  12/20/2010  Job:  [id number]  cc:   Toni Arthurs, MD Larina Earthly, M.D.  Electronically Signed by Illene Regulus MD on 01/04/2011 09:01:24 AM

## 2011-01-16 ENCOUNTER — Encounter (HOSPITAL_BASED_OUTPATIENT_CLINIC_OR_DEPARTMENT_OTHER): Payer: Medicare Other | Attending: Internal Medicine

## 2011-01-16 DIAGNOSIS — M009 Pyogenic arthritis, unspecified: Secondary | ICD-10-CM | POA: Insufficient documentation

## 2011-01-16 DIAGNOSIS — T8189XA Other complications of procedures, not elsewhere classified, initial encounter: Secondary | ICD-10-CM | POA: Insufficient documentation

## 2011-01-16 DIAGNOSIS — Y835 Amputation of limb(s) as the cause of abnormal reaction of the patient, or of later complication, without mention of misadventure at the time of the procedure: Secondary | ICD-10-CM | POA: Insufficient documentation

## 2011-01-16 DIAGNOSIS — Z79899 Other long term (current) drug therapy: Secondary | ICD-10-CM | POA: Insufficient documentation

## 2011-01-16 DIAGNOSIS — Z7902 Long term (current) use of antithrombotics/antiplatelets: Secondary | ICD-10-CM | POA: Insufficient documentation

## 2011-01-16 DIAGNOSIS — M869 Osteomyelitis, unspecified: Secondary | ICD-10-CM | POA: Insufficient documentation

## 2011-01-16 DIAGNOSIS — Z7982 Long term (current) use of aspirin: Secondary | ICD-10-CM | POA: Insufficient documentation

## 2011-01-16 DIAGNOSIS — S98139A Complete traumatic amputation of one unspecified lesser toe, initial encounter: Secondary | ICD-10-CM | POA: Insufficient documentation

## 2011-01-16 DIAGNOSIS — I739 Peripheral vascular disease, unspecified: Secondary | ICD-10-CM | POA: Insufficient documentation

## 2011-01-17 ENCOUNTER — Encounter (HOSPITAL_BASED_OUTPATIENT_CLINIC_OR_DEPARTMENT_OTHER): Payer: Medicare Other

## 2011-01-17 NOTE — Progress Notes (Unsigned)
[facility]  NAME:  [name], [name] NO.:  [id number]  MEDICAL RECORD NO.:  [id number]      DATE OF BIRTH:  Oct 18, 1926  PHYSICIAN:  Maxwell Caul, M.D. VISIT DATE:  01/16/2011                                  OFFICE VISIT   [name] is an 77 year old man who underwent a right transmetatarsal amputation at the end of June.  I have an operative report here by Dr. Victorino Dike that stated he had right foot osteomyelitis status post right first and second toe amputations.  He had been found to have pyarthrosis of his third metatarsal phalangeal joint as well as osteomyelitis of the head of the third metatarsal and third toe phalanges.  This trip to the OR, he underwent a transmetatarsal amputation along with percutaneous heel cord lengthening.  He has been transferred to [facility] for rehabilitation.  The note I have from Dr. Victorino Dike was on January 03, 2011.  At that point, he felt that the anterior incision is macerated and not healing.  He did not feel there were any signs of infection.  PAST MEDICAL HISTORY:  Osteomyelitis of the right third toe, arthritis, peripheral vascular disease, status post bypass surgery on the right in 2003.  He has had a right hip replacement, left knee replacement, and finally a right foot transmetatarsal amputation on January 03, 2011.  Medication list is reviewed.  He is on: 1. Amlodipine 10 mg a day. 2. Ambien 5 mg nightly. 3. ASA 81 daily. 4. Creon capsules 12,000 units 3 times daily (presumably pancreatic     insufficiency). 5. Plavix 75 daily. 6. Protonix 40 daily. 7. Zoloft 50 daily. 8. Ferrous sulfate 325 daily.  SOCIAL HISTORY:  The patient lives with his son.  Functional level is uncertain.  REVIEW OF SYSTEMS:  RESPIRATORY:  No shortness of breath.  CARDIAC:  No chest pain.  EXTREMITIES:  He really does not describe claudication on the right, although I am not really certain of his functional  status.  PHYSICAL EXAMINATION:  VITAL SIGNS:  Temperature 98.2, pulse 61, respirations 18, and blood pressure elevated 198/57. RESPIRATORY:  Clear air entry bilaterally. CARDIAC:  Heart sounds are normal.  There are no murmurs. VASCULAR:  He does not have palpable pulses anywhere below the femoral. The area in question is in the mid aspect of his right transmetatarsal amputation site.  Actually, the rest of the wound incision site here looks fine.  It appears to be well approximated.  Sutures are still in. However, there is a dehiscence in the middle of this surgical incision site measuring 0.3 x 0.8 x 1.5.  There is also undermining of 1.1 cm at 3 o'clock.  I did swab this area again for CNS.  However, I did not start empiric antibiotics.  There was no gross purulent drainage and no tenderness.  IMPRESSION:  Surgical site nonhealing with recent dehiscence.  I did remove the suture from this area which is largely not doing anything effective.  I did not touch any of the other remaining sutures and as mentioned the site actually looks quite good.  Unfortunately, I was not able to get on e-chart today to check the actual surgical cultures.  I did repeat a culture today  in the open area of dehiscence.  We packed this site with Aquacel Ag, which can be changed at the nursing home in 3 days.  We will then review this again next Thursday at which time I should be able to review previous cultures.  As mentioned, no antibiotics were started.  He is already in a healing sandal.          ______________________________ Maxwell Caul, M.D.     MGR/MEDQ  D:  01/16/2011  T:  01/17/2011  Job:  [id number]

## 2011-01-30 ENCOUNTER — Encounter (HOSPITAL_BASED_OUTPATIENT_CLINIC_OR_DEPARTMENT_OTHER): Payer: Medicare Other | Attending: Internal Medicine

## 2011-01-30 DIAGNOSIS — L97509 Non-pressure chronic ulcer of other part of unspecified foot with unspecified severity: Secondary | ICD-10-CM | POA: Insufficient documentation

## 2011-01-30 DIAGNOSIS — Y838 Other surgical procedures as the cause of abnormal reaction of the patient, or of later complication, without mention of misadventure at the time of the procedure: Secondary | ICD-10-CM | POA: Insufficient documentation

## 2011-01-30 DIAGNOSIS — T8131XA Disruption of external operation (surgical) wound, not elsewhere classified, initial encounter: Secondary | ICD-10-CM | POA: Insufficient documentation

## 2011-02-05 ENCOUNTER — Telehealth: Payer: Self-pay | Admitting: *Deleted

## 2011-02-05 NOTE — Telephone Encounter (Signed)
Daughter called and is req recommendation for MD in [facility] where patient will be moving.

## 2011-02-06 NOTE — Telephone Encounter (Signed)
Don't know any doctors in [place]. Recommend they check with local medical society or state medical society. They can also check for board certified internists in the area.

## 2011-02-06 NOTE — Telephone Encounter (Signed)
Pt's daughter advised of same and understood.

## 2011-02-25 DIAGNOSIS — I10 Essential (primary) hypertension: Secondary | ICD-10-CM

## 2011-02-25 DIAGNOSIS — Z4789 Encounter for other orthopedic aftercare: Secondary | ICD-10-CM

## 2011-02-25 DIAGNOSIS — D649 Anemia, unspecified: Secondary | ICD-10-CM

## 2011-02-25 DIAGNOSIS — J449 Chronic obstructive pulmonary disease, unspecified: Secondary | ICD-10-CM

## 2011-02-25 DIAGNOSIS — S98919A Complete traumatic amputation of unspecified foot, level unspecified, initial encounter: Secondary | ICD-10-CM

## 2011-03-06 ENCOUNTER — Encounter (HOSPITAL_BASED_OUTPATIENT_CLINIC_OR_DEPARTMENT_OTHER): Payer: Medicare Other

## 2011-03-27 LAB — DIFFERENTIAL
Band Neutrophils: 0
Basophils Absolute: 0
Basophils Absolute: 0
Basophils Absolute: 0
Basophils Relative: 0
Basophils Relative: 0
Blasts: 0
Eosinophils Absolute: 0
Eosinophils Absolute: 0
Eosinophils Absolute: 0.1
Eosinophils Relative: 0
Eosinophils Relative: 0
Eosinophils Relative: 2
Lymphocytes Relative: 15
Lymphocytes Relative: 21
Lymphs Abs: 0.7
Lymphs Abs: 1.3
Metamyelocytes Relative: 0
Monocytes Absolute: 0.3
Monocytes Absolute: 1.1 — ABNORMAL HIGH
Monocytes Relative: 10
Monocytes Relative: 12
Neutro Abs: 6.4
Neutrophils Relative %: 73

## 2011-03-27 LAB — URINE CULTURE: Colony Count: >=100000

## 2011-03-27 LAB — BLOOD GAS, ARTERIAL
Bicarbonate: 12.6 — ABNORMAL LOW
FIO2: 0.21
O2 Saturation: 97.8
pH, Arterial: 7.444
pO2, Arterial: 101 — ABNORMAL HIGH

## 2011-03-27 LAB — URINALYSIS, ROUTINE W REFLEX MICROSCOPIC
Glucose, UA: NEGATIVE
Ketones, ur: 15 — AB
Nitrite: NEGATIVE
Protein, ur: 100 — AB
Specific Gravity, Urine: 1.022
Urobilinogen, UA: 1
pH: 5.5

## 2011-03-27 LAB — CBC
HCT: 25.4 — ABNORMAL LOW
HCT: 25.9 — ABNORMAL LOW
HCT: 26.8 — ABNORMAL LOW
HCT: 33.2 — ABNORMAL LOW
Hemoglobin: 10 — ABNORMAL LOW
Hemoglobin: 11.7 — ABNORMAL LOW
Hemoglobin: 8.7 — ABNORMAL LOW
MCHC: 35.2
MCV: 101.6 — ABNORMAL HIGH
MCV: 102.6 — ABNORMAL HIGH
MCV: 102.8 — ABNORMAL HIGH
Platelets: 77 — ABNORMAL LOW
Platelets: 79 — ABNORMAL LOW
RBC: 2.42 — ABNORMAL LOW
RBC: 2.44 — ABNORMAL LOW
RBC: 2.52 — ABNORMAL LOW
RBC: 2.77 — ABNORMAL LOW
RBC: 3.23 — ABNORMAL LOW
RDW: 20.8 — ABNORMAL HIGH
RDW: 21.6 — ABNORMAL HIGH
RDW: 21.7 — ABNORMAL HIGH
WBC: 4.9
WBC: 6.2
WBC: 6.8
WBC: 8.8

## 2011-03-27 LAB — COMPREHENSIVE METABOLIC PANEL
ALT: 67 — ABNORMAL HIGH
ALT: 77 — ABNORMAL HIGH
AST: 125 — ABNORMAL HIGH
AST: 166 — ABNORMAL HIGH
Albumin: 2.8 — ABNORMAL LOW
Albumin: 3.6
Alkaline Phosphatase: 50
BUN: 29 — ABNORMAL HIGH
CO2: 19
CO2: 20
Calcium: 9.5
Chloride: 104
Chloride: 106
Creatinine, Ser: 1.41
Creatinine, Ser: 1.46
GFR calc Af Amer: 56 — ABNORMAL LOW
GFR calc Af Amer: 59 — ABNORMAL LOW
GFR calc non Af Amer: 47 — ABNORMAL LOW
GFR calc non Af Amer: 48 — ABNORMAL LOW
Glucose, Bld: 89
Potassium: 3.5
Sodium: 137
Sodium: 139
Total Bilirubin: 1.8 — ABNORMAL HIGH
Total Bilirubin: 1.9 — ABNORMAL HIGH
Total Protein: 5.8 — ABNORMAL LOW

## 2011-03-27 LAB — AMYLASE
Amylase: 460 — ABNORMAL HIGH
Amylase: 706 — ABNORMAL HIGH

## 2011-03-27 LAB — LIPASE, BLOOD
Lipase: 141 — ABNORMAL HIGH
Lipase: 150 — ABNORMAL HIGH
Lipase: 508 — ABNORMAL HIGH
Lipase: 877 — ABNORMAL HIGH
Lipase: 91 — ABNORMAL HIGH

## 2011-03-27 LAB — CK TOTAL AND CKMB (NOT AT ARMC): Total CK: 243 — ABNORMAL HIGH

## 2011-03-27 LAB — POCT CARDIAC MARKERS
CKMB, poc: 2.8
Myoglobin, poc: 328
Operator id: 277751
Troponin i, poc: <0.05

## 2011-03-27 LAB — COMPREHENSIVE METABOLIC PANEL WITH GFR
Alkaline Phosphatase: 56
BUN: 31 — ABNORMAL HIGH
Glucose, Bld: 98
Potassium: 3.4 — ABNORMAL LOW
Total Protein: 7.2

## 2011-03-27 LAB — BASIC METABOLIC PANEL
Calcium: 8.3 — ABNORMAL LOW
Chloride: 108
Creatinine, Ser: 1.2
Creatinine, Ser: 1.31
GFR calc Af Amer: >60
GFR calc Af Amer: >60
GFR calc Af Amer: >60
GFR calc non Af Amer: 53 — ABNORMAL LOW
GFR calc non Af Amer: 58 — ABNORMAL LOW
GFR calc non Af Amer: >60
Glucose, Bld: 91
Potassium: 3 — ABNORMAL LOW
Potassium: 3.3 — ABNORMAL LOW
Sodium: 137

## 2011-03-27 LAB — CARDIAC PANEL(CRET KIN+CKTOT+MB+TROPI)
CK, MB: 2.3
Relative Index: 1.2
Total CK: 199
Troponin I: 0.02

## 2011-03-27 LAB — SAMPLE TO BLOOD BANK

## 2011-03-27 LAB — HEPATITIS PANEL, ACUTE
HCV Ab: NEGATIVE
Hep B C IgM: NEGATIVE
Hepatitis B Surface Ag: NEGATIVE

## 2011-03-27 LAB — URINE MICROSCOPIC-ADD ON

## 2011-03-27 LAB — FOLATE: Folate: 4.9

## 2011-03-27 LAB — ETHANOL: Alcohol, Ethyl (B): 59 — ABNORMAL HIGH

## 2011-03-27 LAB — APTT: aPTT: 33

## 2011-03-27 LAB — LACTATE DEHYDROGENASE: LDH: 312 — ABNORMAL HIGH

## 2011-03-27 LAB — LACTIC ACID, PLASMA: Lactic Acid, Venous: 2.1

## 2011-03-27 LAB — RETICULOCYTES
Retic Count, Absolute: 31.3
Retic Ct Pct: 1

## 2011-03-27 LAB — IRON AND TIBC: Saturation Ratios: 25

## 2011-03-27 LAB — LIPID PANEL: VLDL: 11

## 2011-04-01 LAB — DIFFERENTIAL
Eosinophils Absolute: 0 10*3/uL (ref 0.0–0.7)
Eosinophils Relative: 0 % (ref 0–5)
Lymphocytes Relative: 13 % (ref 12–46)
Lymphs Abs: 1.1 10*3/uL (ref 0.7–4.0)
Monocytes Absolute: 1.1 10*3/uL — ABNORMAL HIGH (ref 0.1–1.0)
Monocytes Relative: 13 % — ABNORMAL HIGH (ref 3–12)

## 2011-04-01 LAB — BASIC METABOLIC PANEL
BUN: 46 mg/dL — ABNORMAL HIGH (ref 6–23)
CO2: 17 mEq/L — ABNORMAL LOW (ref 19–32)
Calcium: 9.3 mg/dL (ref 8.4–10.5)
Creatinine, Ser: 2.1 mg/dL — ABNORMAL HIGH (ref 0.4–1.5)
GFR calc non Af Amer: 31 mL/min — ABNORMAL LOW (ref >60)
Glucose, Bld: 117 mg/dL — ABNORMAL HIGH (ref 70–99)

## 2011-04-01 LAB — CBC
Hemoglobin: 10.7 g/dL — ABNORMAL LOW (ref 13.0–17.0)
MCHC: 34 g/dL (ref 30.0–36.0)
Platelets: 211 10*3/uL (ref 150–400)
RDW: 14.1 % (ref 11.5–15.5)

## 2011-04-03 LAB — CBC
HCT: 30.7 % — ABNORMAL LOW (ref 39.0–52.0)
MCHC: 33.9 g/dL (ref 30.0–36.0)
MCV: 100.7 fL — ABNORMAL HIGH (ref 78.0–100.0)
MCV: 100.7 fL — ABNORMAL HIGH (ref 78.0–100.0)
Platelets: 194 10*3/uL (ref 150–400)
Platelets: 243 10*3/uL (ref 150–400)
Platelets: 308 10*3/uL (ref 150–400)
RDW: 14.4 % (ref 11.5–15.5)
WBC: 8.5 10*3/uL (ref 4.0–10.5)
WBC: 8.9 10*3/uL (ref 4.0–10.5)

## 2011-04-03 LAB — WOUND CULTURE

## 2011-04-03 LAB — BASIC METABOLIC PANEL
BUN: 17 mg/dL (ref 6–23)
BUN: 23 mg/dL (ref 6–23)
BUN: 39 mg/dL — ABNORMAL HIGH (ref 6–23)
CO2: 18 mEq/L — ABNORMAL LOW (ref 19–32)
CO2: 19 mEq/L (ref 19–32)
Calcium: 8.9 mg/dL (ref 8.4–10.5)
Calcium: 9.2 mg/dL (ref 8.4–10.5)
Calcium: 9.3 mg/dL (ref 8.4–10.5)
Chloride: 108 mEq/L (ref 96–112)
Chloride: 109 mEq/L (ref 96–112)
Creatinine, Ser: 1.31 mg/dL (ref 0.4–1.5)
Creatinine, Ser: 1.59 mg/dL — ABNORMAL HIGH (ref 0.4–1.5)
Creatinine, Ser: 1.74 mg/dL — ABNORMAL HIGH (ref 0.4–1.5)
GFR calc Af Amer: 51 mL/min — ABNORMAL LOW (ref >60)
GFR calc Af Amer: >60 mL/min (ref >60)
GFR calc non Af Amer: 42 mL/min — ABNORMAL LOW (ref >60)
GFR calc non Af Amer: 43 mL/min — ABNORMAL LOW (ref >60)
GFR calc non Af Amer: 53 mL/min — ABNORMAL LOW (ref >60)
Glucose, Bld: 100 mg/dL — ABNORMAL HIGH (ref 70–99)
Glucose, Bld: 102 mg/dL — ABNORMAL HIGH (ref 70–99)
Glucose, Bld: 108 mg/dL — ABNORMAL HIGH (ref 70–99)
Potassium: 4.1 mEq/L (ref 3.5–5.1)
Potassium: 4.3 mEq/L (ref 3.5–5.1)
Sodium: 135 mEq/L (ref 135–145)

## 2011-04-03 LAB — VANCOMYCIN, TROUGH
Vancomycin Tr: 15.3 ug/mL (ref 10.0–20.0)
Vancomycin Tr: 8.7 ug/mL — ABNORMAL LOW (ref 10.0–20.0)

## 2011-04-03 LAB — ANAEROBIC CULTURE

## 2011-04-03 LAB — IRON: Iron: 34 ug/dL — ABNORMAL LOW (ref 42–135)

## 2011-04-03 LAB — LIPID PANEL
HDL: 43 mg/dL (ref >39)
Total CHOL/HDL Ratio: 3.3 RATIO
Triglycerides: 107 mg/dL (ref <150)

## 2011-04-03 LAB — GLUCOSE, CAPILLARY: Glucose-Capillary: 81 mg/dL (ref 70–99)

## 2011-04-03 LAB — TSH: TSH: 0.335 u[IU]/mL — ABNORMAL LOW (ref 0.350–4.500)

## 2011-04-09 LAB — I-STAT 8, (EC8 V) (CONVERTED LAB)
BUN: 20
Bicarbonate: 22.5
HCT: 38 — ABNORMAL LOW
Hemoglobin: 12.9 — ABNORMAL LOW
Operator id: 284141
Potassium: 3.4 — ABNORMAL LOW
Sodium: 140
TCO2: 24

## 2011-04-09 LAB — DIFFERENTIAL
Basophils Absolute: 0
Basophils Relative: 1
Eosinophils Absolute: 0.1
Eosinophils Relative: 3
Lymphocytes Relative: 22
Lymphs Abs: 1.1
Monocytes Absolute: 0.6
Monocytes Relative: 12 — ABNORMAL HIGH
Neutro Abs: 3
Neutrophils Relative %: 63

## 2011-04-09 LAB — CBC
HCT: 33.6 — ABNORMAL LOW
Hemoglobin: 11 — ABNORMAL LOW
MCHC: 32.8
MCV: 98.4
Platelets: 243
RBC: 3.41 — ABNORMAL LOW
RDW: 19.2 — ABNORMAL HIGH
WBC: 4.8

## 2011-04-09 LAB — POCT I-STAT CREATININE: Operator id: 284141

## 2011-04-15 LAB — CROSSMATCH

## 2011-11-18 ENCOUNTER — Encounter: Payer: Self-pay | Admitting: Neurosurgery

## 2011-11-19 ENCOUNTER — Ambulatory Visit: Payer: Medicare Other | Admitting: Neurosurgery

## 2012-06-17 ENCOUNTER — Encounter: Payer: Self-pay | Admitting: Vascular Surgery

## 2012-12-28 DEATH — deceased

## 2014-12-23 ENCOUNTER — Encounter: Payer: Self-pay | Admitting: Internal Medicine

## 2014-12-23 NOTE — Progress Notes (Signed)
   Subjective:    Patient ID: [name], male    DOB: [birth year], 81 y.o.   MRN: [id number]  HPI    Review of Systems     Objective:   Physical Exam        Assessment & Plan:  Last seen by Dr Debby Bud 11/2010.
